# Patient Record
Sex: Male | Born: 1960 | Race: White | Hispanic: No | Marital: Married | Smoking: Never smoker
Health system: Southern US, Community
[De-identification: ages and names within clinical notes are randomized; demographics above are authoritative.]

## PROBLEM LIST (undated history)

## (undated) DIAGNOSIS — E785 Hyperlipidemia, unspecified: Secondary | ICD-10-CM

## (undated) DIAGNOSIS — Z8249 Family history of ischemic heart disease and other diseases of the circulatory system: Secondary | ICD-10-CM

## (undated) DIAGNOSIS — Z87442 Personal history of urinary calculi: Secondary | ICD-10-CM

## (undated) DIAGNOSIS — M1990 Unspecified osteoarthritis, unspecified site: Secondary | ICD-10-CM

## (undated) HISTORY — DX: Family history of ischemic heart disease and other diseases of the circulatory system: Z82.49

## (undated) HISTORY — DX: Hyperlipidemia, unspecified: E78.5

---
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
## 1997-11-25 HISTORY — PX: SHOULDER OPEN ROTATOR CUFF REPAIR: SHX2407

## 2002-02-01 ENCOUNTER — Encounter: Payer: Self-pay | Admitting: Family Medicine

## 2002-02-01 ENCOUNTER — Encounter: Admission: RE | Admit: 2002-02-01 | Discharge: 2002-02-01 | Payer: Self-pay | Admitting: Family Medicine

## 2007-11-26 HISTORY — PX: KNEE ARTHROSCOPY: SHX127

## 2008-12-27 ENCOUNTER — Ambulatory Visit (HOSPITAL_BASED_OUTPATIENT_CLINIC_OR_DEPARTMENT_OTHER): Admission: RE | Admit: 2008-12-27 | Discharge: 2008-12-27 | Payer: Self-pay | Admitting: Orthopaedic Surgery

## 2009-11-25 HISTORY — PX: OTHER SURGICAL HISTORY: SHX169

## 2010-08-04 ENCOUNTER — Emergency Department (HOSPITAL_COMMUNITY): Admission: EM | Admit: 2010-08-04 | Discharge: 2010-08-04 | Payer: Self-pay | Admitting: Emergency Medicine

## 2010-08-07 ENCOUNTER — Encounter: Admission: RE | Admit: 2010-08-07 | Discharge: 2010-08-07 | Payer: Self-pay | Admitting: Orthopedic Surgery

## 2011-03-12 LAB — POCT HEMOGLOBIN-HEMACUE: Hemoglobin: 15.9 g/dL (ref 13.0–17.0)

## 2011-04-09 NOTE — Op Note (Signed)
NAMEMELBOURNE, JAKUBIAK                 ACCOUNT NO.:  [id number]   MEDICAL RECORD NO.:  [id number]          PATIENT TYPE:  AMB   LOCATION:  DSC                          FACILITY:  [facility]   PHYSICIAN:  Lubertha Basque. Dalldorf, M.D.DATE OF BIRTH:  May 25, 1961   DATE OF PROCEDURE:  DATE OF DISCHARGE:                               OPERATIVE REPORT   PREOPERATIVE DIAGNOSIS:  Right knee torn medial meniscus.   POSTOPERATIVE DIAGNOSIS:  Right knee torn medial meniscus.   PROCEDURE:  Right knee partial medial meniscectomy.   ANESTHESIA:  General.   ATTENDING SURGEON:  Lubertha Basque. Jerl Santos, MD   ASSISTANT:  Lindwood Qua, PA   INDICATIONS FOR PROCEDURE:  The patient is a 50 year old male with  several months of intense right knee pain and swelling.  This has  persisted despite oral anti-inflammatories and activity restriction.  By  MRI scan, he has a posterior horn medial meniscus tear.  He has pain  which limits his ability to rest and walk, and he was offered an  arthroscopy.  Informed operative consent was obtained after discussion  of possible complications including reaction to anesthesia and  infection.   SUMMARY OF FINDINGS AND PROCEDURE:  Under general anesthesia, an  arthroscopy of the right knee was performed.  Suprapatellar pouch was  benign, as was the patellofemoral joint.  Medial compartment exhibited  radial tear of the posterior horn medial meniscus addressed with about  10% partial medial meniscectomy back to stable tissues.  He did have  some focal grade 3 change likely associated with this tear, and a  chondroplasty was done but no bare bone was exposed.  The ACL was intact  and the lateral compartment was completely benign.   DESCRIPTION OF PROCEDURE:  The patient was taken to the operating suite  where general anesthetic was applied without difficulty.  He was  positioned supine and prepped and draped in normal sterile fashion.  After administration of IV Kefzol, an  arthroscopy of the right knee was  performed through 2 inferior portals.  Findings were as noted above and  procedure consisted of the partial meniscectomy done with basket and  shaver.  The knee was thoroughly irrigated followed by placement of  Marcaine with epinephrine and morphine.  Adaptic was placed over the  portals followed by dry gauze and a loose Ace wrap.  Estimated blood  loss and intraoperative fluids obtained from anesthesia records.   DISPOSITION:  The patient was extubated in the operating room and taken  to recovery room in stable addition.  He was to go home on the same day  and follow up in the office closely.  I will contact him by phone  tonight.      Lubertha Basque Jerl Santos, M.D.  Electronically Signed     PGD/MEDQ  D:  12/27/2008  T:  12/28/2008  Job:  [id number]

## 2013-07-13 ENCOUNTER — Other Ambulatory Visit: Payer: Self-pay | Admitting: Orthopedic Surgery

## 2013-07-20 ENCOUNTER — Other Ambulatory Visit: Payer: Self-pay | Admitting: Orthopedic Surgery

## 2013-07-21 ENCOUNTER — Encounter (HOSPITAL_COMMUNITY): Payer: Self-pay | Admitting: Pharmacy Technician

## 2013-07-22 NOTE — Pre-Procedure Instructions (Signed)
Jorge Mcclain  07/22/2013   Your procedure is scheduled on:  July 30, 2013 at 7:30 AM  Report to [facility] at 5:30 AM.  Call this number if you have problems the morning of surgery: [phone]    Remember:   Do not eat food or drink liquids after midnight.   Take these medicines the morning of surgery with A SIP OF WATER: HYDROcodone-acetaminophen (NORCO/VICODIN)  Stop all Vitamins, Aspirin, Herbal Medications as of 07/24/13.    Do not wear jewelry, make-up or nail polish.  Do not wear lotions, powders, or perfumes. You may wear deodorant.  Do not shave 48 hours prior to surgery. Men may shave face and neck.  Do not bring valuables to the hospital.  [facility] is not responsible                   for any belongings or valuables.  Contacts, dentures or bridgework may not be worn into surgery.  Leave suitcase in the car. After surgery it may be brought to your room.  For patients admitted to the hospital, checkout time is 11:00 AM the day of  discharge.     Special Instructions: Shower using CHG 2 nights before surgery and the night before surgery.  If you shower the day of surgery use CHG.  Use special wash - you have one bottle of CHG for all showers.  You should use approximately 1/3 of the bottle for each shower.   Please read over the following fact sheets that you were given: Pain Booklet, Coughing and Deep Breathing, Total Joint Packet, MRSA Information and Surgical Site Infection Prevention

## 2013-07-23 ENCOUNTER — Other Ambulatory Visit: Payer: Self-pay | Admitting: Orthopedic Surgery

## 2013-07-23 ENCOUNTER — Encounter (HOSPITAL_COMMUNITY)
Admission: RE | Admit: 2013-07-23 | Discharge: 2013-07-23 | Disposition: A | Discharge status: Home / Self Care | Payer: BC Managed Care – PPO | Source: Ambulatory Visit | Attending: Orthopedic Surgery | Admitting: Orthopedic Surgery

## 2013-07-23 ENCOUNTER — Encounter (HOSPITAL_COMMUNITY): Payer: Self-pay

## 2013-07-23 DIAGNOSIS — Z01818 Encounter for other preprocedural examination: Secondary | ICD-10-CM | POA: Insufficient documentation

## 2013-07-23 DIAGNOSIS — Z01812 Encounter for preprocedural laboratory examination: Secondary | ICD-10-CM | POA: Insufficient documentation

## 2013-07-23 HISTORY — DX: Unspecified osteoarthritis, unspecified site: M19.90

## 2013-07-23 LAB — CBC WITH DIFFERENTIAL/PLATELET
Basophils Absolute: 0 10*3/uL (ref 0.0–0.1)
Basophils Relative: 1 % (ref 0–1)
Eosinophils Absolute: 0.3 10*3/uL (ref 0.0–0.7)
Eosinophils Relative: 4 % (ref 0–5)
HCT: 44.3 % (ref 39.0–52.0)
Hemoglobin: 16.2 g/dL (ref 13.0–17.0)
Lymphocytes Relative: 32 % (ref 12–46)
Lymphs Abs: 2.5 10*3/uL (ref 0.7–4.0)
MCH: 31.3 pg (ref 26.0–34.0)
MCHC: 36.6 g/dL — ABNORMAL HIGH (ref 30.0–36.0)
MCV: 85.7 fL (ref 78.0–100.0)
Monocytes Absolute: 0.6 10*3/uL (ref 0.1–1.0)
Monocytes Relative: 7 % (ref 3–12)
Neutro Abs: 4.3 10*3/uL (ref 1.7–7.7)
Neutrophils Relative %: 56 % (ref 43–77)
Platelets: 245 10*3/uL (ref 150–400)
RBC: 5.17 MIL/uL (ref 4.22–5.81)
RDW: 12.2 % (ref 11.5–15.5)
WBC: 7.7 10*3/uL (ref 4.0–10.5)

## 2013-07-23 LAB — TYPE AND SCREEN
ABO/RH(D): B POS
Antibody Screen: NEGATIVE

## 2013-07-23 LAB — ABO/RH: ABO/RH(D): B POS

## 2013-07-23 LAB — URINALYSIS, ROUTINE W REFLEX MICROSCOPIC
Bilirubin Urine: NEGATIVE
Glucose, UA: NEGATIVE mg/dL
Hgb urine dipstick: NEGATIVE
Ketones, ur: NEGATIVE mg/dL
Leukocytes, UA: NEGATIVE
Nitrite: NEGATIVE
Protein, ur: NEGATIVE mg/dL
Specific Gravity, Urine: 1.022 (ref 1.005–1.030)
Urobilinogen, UA: 0.2 mg/dL (ref 0.0–1.0)
pH: 6.5 (ref 5.0–8.0)

## 2013-07-23 LAB — COMPREHENSIVE METABOLIC PANEL
ALT: 53 U/L (ref 0–53)
AST: 30 U/L (ref 0–37)
Albumin: 4.4 g/dL (ref 3.5–5.2)
Alkaline Phosphatase: 62 U/L (ref 39–117)
BUN: 20 mg/dL (ref 6–23)
CO2: 28 mEq/L (ref 19–32)
Calcium: 10.4 mg/dL (ref 8.4–10.5)
Chloride: 99 mEq/L (ref 96–112)
Creatinine, Ser: 1.12 mg/dL (ref 0.50–1.35)
GFR calc Af Amer: 86 mL/min — ABNORMAL LOW (ref >90)
GFR calc non Af Amer: 74 mL/min — ABNORMAL LOW (ref >90)
Glucose, Bld: 110 mg/dL — ABNORMAL HIGH (ref 70–99)
Potassium: 4.3 mEq/L (ref 3.5–5.1)
Sodium: 140 mEq/L (ref 135–145)
Total Bilirubin: 1.8 mg/dL — ABNORMAL HIGH (ref 0.3–1.2)
Total Protein: 8.4 g/dL — ABNORMAL HIGH (ref 6.0–8.3)

## 2013-07-23 LAB — PROTIME-INR
INR: 1 (ref 0.00–1.49)
Prothrombin Time: 13 seconds (ref 11.6–15.2)

## 2013-07-23 LAB — SURGICAL PCR SCREEN
MRSA, PCR: NEGATIVE
Staphylococcus aureus: POSITIVE — AB

## 2013-07-23 LAB — APTT: aPTT: 30 seconds (ref 24–37)

## 2013-07-23 NOTE — Progress Notes (Signed)
Talked with Jorge Mcclain at Dr Luiz Blare office , she will put laterality in Epic for surgery. The surgery is for Right hip.

## 2013-07-29 MED ORDER — CEFAZOLIN SODIUM-DEXTROSE 2-3 GM-% IV SOLR
2.0000 g | INTRAVENOUS | Status: AC
  Administered 2013-07-30: 2 g via INTRAVENOUS
  Filled 2013-07-29: qty 50

## 2013-07-30 ENCOUNTER — Encounter (HOSPITAL_COMMUNITY)
Admission: RE | Disposition: A | Discharge status: Home / Self Care | Payer: Self-pay | Source: Ambulatory Visit | Attending: Orthopedic Surgery

## 2013-07-30 ENCOUNTER — Inpatient Hospital Stay (HOSPITAL_COMMUNITY)
Admission: RE | Admit: 2013-07-30 | Discharge: 2013-07-31 | DRG: 818 | Disposition: A | Discharge status: Home / Self Care | Payer: BC Managed Care – PPO | Source: Ambulatory Visit | Attending: Orthopedic Surgery | Admitting: Orthopedic Surgery

## 2013-07-30 ENCOUNTER — Ambulatory Visit (HOSPITAL_COMMUNITY): Payer: BC Managed Care – PPO | Admitting: Anesthesiology

## 2013-07-30 ENCOUNTER — Ambulatory Visit (HOSPITAL_COMMUNITY): Payer: BC Managed Care – PPO

## 2013-07-30 ENCOUNTER — Encounter (HOSPITAL_COMMUNITY): Payer: Self-pay | Admitting: *Deleted

## 2013-07-30 ENCOUNTER — Encounter (HOSPITAL_COMMUNITY): Payer: Self-pay | Admitting: Anesthesiology

## 2013-07-30 DIAGNOSIS — M169 Osteoarthritis of hip, unspecified: Principal | ICD-10-CM | POA: Diagnosis present

## 2013-07-30 DIAGNOSIS — M1610 Unilateral primary osteoarthritis, unspecified hip: Principal | ICD-10-CM | POA: Diagnosis present

## 2013-07-30 DIAGNOSIS — M1611 Unilateral primary osteoarthritis, right hip: Secondary | ICD-10-CM

## 2013-07-30 DIAGNOSIS — Z7982 Long term (current) use of aspirin: Secondary | ICD-10-CM

## 2013-07-30 HISTORY — PX: TOTAL HIP ARTHROPLASTY: SHX124

## 2013-07-30 SURGERY — ARTHROPLASTY, HIP, TOTAL, ANTERIOR APPROACH
Anesthesia: General | Site: Hip | Laterality: Right | Wound class: Clean

## 2013-07-30 MED ORDER — PROMETHAZINE HCL 25 MG/ML IJ SOLN: 12.5000 mg | Freq: Four times a day (QID) | INTRAMUSCULAR | Status: DC | PRN

## 2013-07-30 MED ORDER — METHOCARBAMOL 500 MG PO TABS
500.0000 mg | ORAL_TABLET | Freq: Four times a day (QID) | ORAL | Status: DC | PRN
  Administered 2013-07-30: 500 mg via ORAL

## 2013-07-30 MED ORDER — KETOROLAC TROMETHAMINE 15 MG/ML IJ SOLN
15.0000 mg | Freq: Four times a day (QID) | INTRAMUSCULAR | Status: AC
  Administered 2013-07-30 – 2013-07-31 (×4): 15 mg via INTRAVENOUS
  Filled 2013-07-30 (×4): qty 1

## 2013-07-30 MED ORDER — MIDAZOLAM HCL 5 MG/5ML IJ SOLN
INTRAMUSCULAR | Status: DC | PRN
  Administered 2013-07-30: 2 mg via INTRAVENOUS

## 2013-07-30 MED ORDER — ZOLPIDEM TARTRATE 5 MG PO TABS: 5.0000 mg | ORAL_TABLET | Freq: Every evening | ORAL | Status: DC | PRN

## 2013-07-30 MED ORDER — ACETAMINOPHEN 325 MG PO TABS: 650.0000 mg | ORAL_TABLET | Freq: Four times a day (QID) | ORAL | Status: DC | PRN

## 2013-07-30 MED ORDER — 0.9 % SODIUM CHLORIDE (POUR BTL) OPTIME
TOPICAL | Status: DC | PRN
  Administered 2013-07-30: 1000 mL

## 2013-07-30 MED ORDER — HYDROMORPHONE HCL PF 1 MG/ML IJ SOLN
0.2500 mg | INTRAMUSCULAR | Status: DC | PRN
  Administered 2013-07-30: 0.25 mg via INTRAVENOUS
  Administered 2013-07-30 (×3): 0.5 mg via INTRAVENOUS

## 2013-07-30 MED ORDER — METHOCARBAMOL 500 MG PO TABS
ORAL_TABLET | ORAL | Status: AC
  Filled 2013-07-30: qty 1

## 2013-07-30 MED ORDER — LIDOCAINE HCL (CARDIAC) 20 MG/ML IV SOLN
INTRAVENOUS | Status: DC | PRN
  Administered 2013-07-30: 100 mg via INTRAVENOUS

## 2013-07-30 MED ORDER — PROPOFOL 10 MG/ML IV BOLUS
INTRAVENOUS | Status: DC | PRN
  Administered 2013-07-30: 150 mg via INTRAVENOUS

## 2013-07-30 MED ORDER — ASPIRIN EC 325 MG PO TBEC
325.0000 mg | DELAYED_RELEASE_TABLET | Freq: Two times a day (BID) | ORAL | Status: DC
  Administered 2013-07-30 – 2013-07-31 (×2): 325 mg via ORAL
  Filled 2013-07-30 (×4): qty 1

## 2013-07-30 MED ORDER — DEXAMETHASONE 6 MG PO TABS
10.0000 mg | ORAL_TABLET | Freq: Three times a day (TID) | ORAL | Status: AC
  Administered 2013-07-30 – 2013-07-31 (×2): 10 mg via ORAL
  Filled 2013-07-30 (×3): qty 1

## 2013-07-30 MED ORDER — ONDANSETRON HCL 4 MG/2ML IJ SOLN: 4.0000 mg | Freq: Four times a day (QID) | INTRAMUSCULAR | Status: DC | PRN

## 2013-07-30 MED ORDER — DEXAMETHASONE SODIUM PHOSPHATE 4 MG/ML IJ SOLN
INTRAMUSCULAR | Status: DC | PRN
  Administered 2013-07-30: 4 mg via INTRAVENOUS

## 2013-07-30 MED ORDER — DEXAMETHASONE SODIUM PHOSPHATE 10 MG/ML IJ SOLN
10.0000 mg | Freq: Three times a day (TID) | INTRAMUSCULAR | Status: AC
  Administered 2013-07-30: 10 mg via INTRAVENOUS
  Filled 2013-07-30 (×4): qty 1

## 2013-07-30 MED ORDER — SODIUM CHLORIDE 0.9 % IV SOLN
INTRAVENOUS | Status: DC
  Administered 2013-07-31: 01:00:00 via INTRAVENOUS

## 2013-07-30 MED ORDER — OXYCODONE-ACETAMINOPHEN 5-325 MG PO TABS
ORAL_TABLET | ORAL | Status: AC
  Filled 2013-07-30: qty 2

## 2013-07-30 MED ORDER — ONDANSETRON HCL 4 MG/2ML IJ SOLN
INTRAMUSCULAR | Status: DC | PRN
  Administered 2013-07-30: 4 mg via INTRAVENOUS

## 2013-07-30 MED ORDER — POVIDONE-IODINE 7.5 % EX SOLN: Freq: Once | CUTANEOUS | Status: DC

## 2013-07-30 MED ORDER — LACTATED RINGERS IV SOLN
INTRAVENOUS | Status: DC | PRN
  Administered 2013-07-30 (×2): via INTRAVENOUS

## 2013-07-30 MED ORDER — METHOCARBAMOL 100 MG/ML IJ SOLN
500.0000 mg | Freq: Four times a day (QID) | INTRAVENOUS | Status: DC | PRN
  Filled 2013-07-30: qty 5

## 2013-07-30 MED ORDER — NEOSTIGMINE METHYLSULFATE 1 MG/ML IJ SOLN
INTRAMUSCULAR | Status: DC | PRN
  Administered 2013-07-30: 3 mg via INTRAVENOUS

## 2013-07-30 MED ORDER — ALUM & MAG HYDROXIDE-SIMETH 200-200-20 MG/5ML PO SUSP: 30.0000 mL | ORAL | Status: DC | PRN

## 2013-07-30 MED ORDER — SODIUM CHLORIDE 0.9 % IV SOLN
1000.0000 mg | INTRAVENOUS | Status: AC
  Administered 2013-07-30: 1000 mg via INTRAVENOUS
  Filled 2013-07-30: qty 10

## 2013-07-30 MED ORDER — HYDROMORPHONE HCL PF 1 MG/ML IJ SOLN
INTRAMUSCULAR | Status: AC
  Filled 2013-07-30: qty 1

## 2013-07-30 MED ORDER — ACETAMINOPHEN 650 MG RE SUPP: 650.0000 mg | Freq: Four times a day (QID) | RECTAL | Status: DC | PRN

## 2013-07-30 MED ORDER — HYDROMORPHONE HCL PF 1 MG/ML IJ SOLN
INTRAMUSCULAR | Status: AC
  Administered 2013-07-30: 0.5 mg
  Filled 2013-07-30: qty 1

## 2013-07-30 MED ORDER — OXYCODONE-ACETAMINOPHEN 5-325 MG PO TABS: 1.0000 | ORAL_TABLET | Freq: Four times a day (QID) | ORAL | Status: AC | PRN

## 2013-07-30 MED ORDER — ROCURONIUM BROMIDE 100 MG/10ML IV SOLN
INTRAVENOUS | Status: DC | PRN
  Administered 2013-07-30: 20 mg via INTRAVENOUS
  Administered 2013-07-30: 50 mg via INTRAVENOUS

## 2013-07-30 MED ORDER — POLYETHYLENE GLYCOL 3350 17 G PO PACK: 17.0000 g | PACK | Freq: Every day | ORAL | Status: DC | PRN

## 2013-07-30 MED ORDER — DOCUSATE SODIUM 100 MG PO CAPS
100.0000 mg | ORAL_CAPSULE | Freq: Two times a day (BID) | ORAL | Status: DC
  Administered 2013-07-30 – 2013-07-31 (×3): 100 mg via ORAL
  Filled 2013-07-30 (×3): qty 1

## 2013-07-30 MED ORDER — CHLORHEXIDINE GLUCONATE 4 % EX LIQD: 60.0000 mL | Freq: Once | CUTANEOUS | Status: DC

## 2013-07-30 MED ORDER — SUFENTANIL CITRATE 50 MCG/ML IV SOLN
INTRAVENOUS | Status: DC | PRN
  Administered 2013-07-30 (×5): 10 ug via INTRAVENOUS

## 2013-07-30 MED ORDER — ONDANSETRON HCL 4 MG PO TABS: 4.0000 mg | ORAL_TABLET | Freq: Four times a day (QID) | ORAL | Status: DC | PRN

## 2013-07-30 MED ORDER — GLYCOPYRROLATE 0.2 MG/ML IJ SOLN
INTRAMUSCULAR | Status: DC | PRN
  Administered 2013-07-30: 0.4 mg via INTRAVENOUS

## 2013-07-30 MED ORDER — OXYCODONE-ACETAMINOPHEN 5-325 MG PO TABS
1.0000 | ORAL_TABLET | ORAL | Status: DC | PRN
  Administered 2013-07-30 – 2013-07-31 (×3): 2 via ORAL
  Filled 2013-07-30 (×2): qty 2

## 2013-07-30 MED ORDER — HYDROMORPHONE HCL PF 1 MG/ML IJ SOLN
1.0000 mg | INTRAMUSCULAR | Status: DC | PRN
  Administered 2013-07-30: 1 mg via INTRAVENOUS
  Filled 2013-07-30 (×2): qty 1

## 2013-07-30 MED ORDER — CEFAZOLIN SODIUM-DEXTROSE 2-3 GM-% IV SOLR
2.0000 g | Freq: Four times a day (QID) | INTRAVENOUS | Status: AC
Start: 2013-07-30 — End: 2013-07-30
  Administered 2013-07-30 (×2): 2 g via INTRAVENOUS
  Filled 2013-07-30 (×2): qty 50

## 2013-07-30 MED ORDER — ASPIRIN EC 325 MG PO TBEC: 325.0000 mg | DELAYED_RELEASE_TABLET | Freq: Two times a day (BID) | ORAL | Status: DC

## 2013-07-30 SURGICAL SUPPLY — 50 items
BANDAGE GAUZE ELAST BULKY 4 IN (GAUZE/BANDAGES/DRESSINGS) IMPLANT
BLADE SAW SGTL 18X1.27X75 (BLADE) ×2 IMPLANT
BLADE SURG ROTATE 9660 (MISCELLANEOUS) IMPLANT
BNDG COHESIVE 6X5 TAN STRL LF (GAUZE/BANDAGES/DRESSINGS) IMPLANT
CAPT HIP PF COP ×2 IMPLANT
CELLS DAT CNTRL 66122 CELL SVR (MISCELLANEOUS) ×1 IMPLANT
CLOTH BEACON ORANGE TIMEOUT ST (SAFETY) ×2 IMPLANT
COVER BACK TABLE 24X17X13 BIG (DRAPES) IMPLANT
COVER SURGICAL LIGHT HANDLE (MISCELLANEOUS) ×2 IMPLANT
DRAPE C-ARM 42X72 X-RAY (DRAPES) ×2 IMPLANT
DRAPE STERI IOBAN 125X83 (DRAPES) ×2 IMPLANT
DRAPE U-SHAPE 47X51 STRL (DRAPES) ×6 IMPLANT
DRSG AQUACEL AG ADV 3.5X10 (GAUZE/BANDAGES/DRESSINGS) ×2 IMPLANT
DRSG MEPILEX BORDER 4X8 (GAUZE/BANDAGES/DRESSINGS) ×2 IMPLANT
DURAPREP 26ML APPLICATOR (WOUND CARE) ×2 IMPLANT
ELECT BLADE 4.0 EZ CLEAN MEGAD (MISCELLANEOUS)
ELECT BLADE TIP CTD 4 INCH (ELECTRODE) ×2 IMPLANT
ELECT CAUTERY BLADE 6.4 (BLADE) ×2 IMPLANT
ELECT REM PT RETURN 9FT ADLT (ELECTROSURGICAL) ×2
ELECTRODE BLDE 4.0 EZ CLN MEGD (MISCELLANEOUS) IMPLANT
ELECTRODE REM PT RTRN 9FT ADLT (ELECTROSURGICAL) ×1 IMPLANT
GAUZE XEROFORM 1X8 LF (GAUZE/BANDAGES/DRESSINGS) ×2 IMPLANT
GLOVE BIOGEL PI IND STRL 8 (GLOVE) ×2 IMPLANT
GLOVE BIOGEL PI INDICATOR 8 (GLOVE) ×2
GLOVE ECLIPSE 7.5 STRL STRAW (GLOVE) ×4 IMPLANT
GOWN STRL NON-REIN LRG LVL3 (GOWN DISPOSABLE) ×4 IMPLANT
GOWN STRL REIN XL XLG (GOWN DISPOSABLE) ×4 IMPLANT
HOOD PEEL AWAY FACE SHEILD DIS (HOOD) ×6 IMPLANT
KIT BASIN OR (CUSTOM PROCEDURE TRAY) ×2 IMPLANT
KIT ROOM TURNOVER OR (KITS) ×2 IMPLANT
MANIFOLD NEPTUNE II (INSTRUMENTS) ×2 IMPLANT
NS IRRIG 1000ML POUR BTL (IV SOLUTION) ×2 IMPLANT
PACK TOTAL JOINT (CUSTOM PROCEDURE TRAY) ×2 IMPLANT
PAD ARMBOARD 7.5X6 YLW CONV (MISCELLANEOUS) ×4 IMPLANT
RTRCTR WOUND ALEXIS 18CM MED (MISCELLANEOUS) ×2
SPONGE LAP 18X18 X RAY DECT (DISPOSABLE) IMPLANT
STAPLER VISISTAT 35W (STAPLE) IMPLANT
STRIP CLOSURE SKIN 1/2X4 (GAUZE/BANDAGES/DRESSINGS) IMPLANT
SUT ETHIBOND NAB CT1 #1 30IN (SUTURE) IMPLANT
SUT MNCRL AB 3-0 PS2 18 (SUTURE) IMPLANT
SUT VIC AB 0 CT1 27 (SUTURE) ×1
SUT VIC AB 0 CT1 27XBRD ANBCTR (SUTURE) ×1 IMPLANT
SUT VIC AB 1 CT1 27 (SUTURE) ×4
SUT VIC AB 1 CT1 27XBRD ANBCTR (SUTURE) ×4 IMPLANT
SUT VIC AB 2-0 CT1 27 (SUTURE) ×1
SUT VIC AB 2-0 CT1 TAPERPNT 27 (SUTURE) ×1 IMPLANT
TOWEL OR 17X24 6PK STRL BLUE (TOWEL DISPOSABLE) ×2 IMPLANT
TOWEL OR 17X26 10 PK STRL BLUE (TOWEL DISPOSABLE) ×2 IMPLANT
TRAY FOLEY CATH 16FRSI W/METER (SET/KITS/TRAYS/PACK) IMPLANT
WATER STERILE IRR 1000ML POUR (IV SOLUTION) ×4 IMPLANT

## 2013-07-30 NOTE — H&P (Signed)
TOTAL HIP ADMISSION H&P  Patient is admitted for right total hip arthroplasty.  Subjective:  Chief Complaint: right hip pain  HPI: Jorge Mcclain, 52 y.o. male, has a history of pain and functional disability in the right hip(s) due to arthritis and patient has failed non-surgical conservative treatments for greater than 12 weeks to include NSAID's and/or analgesics, corticosteriod injections, viscosupplementation injections, flexibility and strengthening excercises and activity modification.  Onset of symptoms was gradual starting 4 years ago with gradually worsening course since that time.The patient noted no past surgery on the right hip(s).  Patient currently rates pain in the right hip at 7 out of 10 with activity. Patient has night pain, worsening of pain with activity and weight bearing, pain that interfers with activities of daily living, pain with passive range of motion and joint swelling. Patient has evidence of subchondral sclerosis, periarticular osteophytes and joint space narrowing by imaging studies. This condition presents safety issues increasing the risk of falls. This patient has had failure of all conservative care.  There is no current active infection.  There are no active problems to display for this patient.  Past Medical History  Diagnosis Date  . Arthritis     Past Surgical History  Procedure Laterality Date  . Knee arthroscopy Right 2009  . Shoulder open rotator cuff repair Right 1999  . Bicef reattachment Right 2011  . Hernia repair Left 1965    groin    Prescriptions prior to admission  Medication Sig Dispense Refill  . aspirin EC 81 MG tablet Take 81 mg by mouth daily.      . Coenzyme Q10 (CO Q 10) 100 MG CAPS Take 1 capsule by mouth daily.      . Flaxseed, Linseed, 1000 MG CAPS Take 1 capsule by mouth daily.      Marland Kitchen glucosamine-chondroitin 500-400 MG tablet Take 1 tablet by mouth daily.      Marland Kitchen HYDROcodone-acetaminophen (NORCO/VICODIN) 5-325 MG per tablet  Take 1 tablet by mouth every 8 (eight) hours as needed for pain.      . Multiple Vitamin (MULTIVITAMIN) tablet Take 1 tablet by mouth daily.      . OMEGA 3 1000 MG CAPS Take 1 capsule by mouth daily.      . simvastatin (ZOCOR) 80 MG tablet Take 80 mg by mouth at bedtime.       No Known Allergies  History  Substance Use Topics  . Smoking status: Never Smoker   . Smokeless tobacco: Never Used  . Alcohol Use: Yes     Comment: occasionally    History reviewed. No pertinent family history.   ROS ROS: I have reviewed the patient's review of systems thoroughly and there are no positive responses as relates to the HPI. Objective:  Physical Exam  Vital signs in last 24 hours: Temp:  [97.7 F (36.5 C)] 97.7 F (36.5 C) (09/05 0601) Pulse Rate:  [71] 71 (09/05 0601) Resp:  [20] 20 (09/05 0601) BP: (136)/(82) 136/82 mmHg (09/05 0601) SpO2:  [97 %] 97 % (09/05 0601) Well-developed well-nourished patient in no acute distress. Alert and oriented x3 HEENT:within normal limits Cardiac: Regular rate and rhythm Pulmonary: Lungs clear to auscultation Abdomen: Soft and nontender.  Normal active bowel sounds  Musculoskeletal: r hip pain on rom.  Limited er and flexion Labs: Recent Results (from the past 2160 hour(s))  URINALYSIS, ROUTINE W REFLEX MICROSCOPIC     Status: None   Collection Time    07/23/13  9:53 AM  Result Value Range   Color, Urine YELLOW  YELLOW   APPearance CLEAR  CLEAR   Specific Gravity, Urine 1.022  1.005 - 1.030   pH 6.5  5.0 - 8.0   Glucose, UA NEGATIVE  NEGATIVE mg/dL   Hgb urine dipstick NEGATIVE  NEGATIVE   Bilirubin Urine NEGATIVE  NEGATIVE   Ketones, ur NEGATIVE  NEGATIVE mg/dL   Protein, ur NEGATIVE  NEGATIVE mg/dL   Urobilinogen, UA 0.2  0.0 - 1.0 mg/dL   Nitrite NEGATIVE  NEGATIVE   Leukocytes, UA NEGATIVE  NEGATIVE   Comment: MICROSCOPIC NOT DONE ON URINES WITH NEGATIVE PROTEIN, BLOOD, LEUKOCYTES, NITRITE, OR GLUCOSE <1000 mg/dL.  SURGICAL PCR  SCREEN     Status: Abnormal   Collection Time    07/23/13  9:53 AM      Result Value Range   MRSA, PCR NEGATIVE  NEGATIVE   Staphylococcus aureus POSITIVE (*) NEGATIVE   Comment:            The Xpert SA Assay (FDA     approved for NASAL specimens     in patients over 75 years of age),     is one component of     a comprehensive surveillance     program.  Test performance has     been validated by [employer] for patients greater     than or equal to 47 year old.     It is not intended     to diagnose infection nor to     guide or monitor treatment.  TYPE AND SCREEN     Status: None   Collection Time    07/23/13 10:00 AM      Result Value Range   ABO/RH(D) B POS     Antibody Screen NEG     Sample Expiration 08/06/2013    ABO/RH     Status: None   Collection Time    07/23/13 10:00 AM      Result Value Range   ABO/RH(D) B POS    APTT     Status: None   Collection Time    07/23/13 10:01 AM      Result Value Range   aPTT 30  24 - 37 seconds  CBC WITH DIFFERENTIAL     Status: Abnormal   Collection Time    07/23/13 10:01 AM      Result Value Range   WBC 7.7  4.0 - 10.5 K/uL   RBC 5.17  4.22 - 5.81 MIL/uL   Hemoglobin 16.2  13.0 - 17.0 g/dL   HCT 91.4  78.2 - 95.6 %   MCV 85.7  78.0 - 100.0 fL   MCH 31.3  26.0 - 34.0 pg   MCHC 36.6 (*) 30.0 - 36.0 g/dL   RDW 21.3  08.6 - 57.8 %   Platelets 245  150 - 400 K/uL   Neutrophils Relative % 56  43 - 77 %   Neutro Abs 4.3  1.7 - 7.7 K/uL   Lymphocytes Relative 32  12 - 46 %   Lymphs Abs 2.5  0.7 - 4.0 K/uL   Monocytes Relative 7  3 - 12 %   Monocytes Absolute 0.6  0.1 - 1.0 K/uL   Eosinophils Relative 4  0 - 5 %   Eosinophils Absolute 0.3  0.0 - 0.7 K/uL   Basophils Relative 1  0 - 1 %   Basophils Absolute 0.0  0.0 - 0.1 K/uL  COMPREHENSIVE METABOLIC PANEL     Status: Abnormal   Collection Time    07/23/13 10:01 AM      Result Value Range   Sodium 140  135 - 145 mEq/L   Potassium 4.3  3.5 - 5.1 mEq/L   Chloride 99   96 - 112 mEq/L   CO2 28  19 - 32 mEq/L   Glucose, Bld 110 (*) 70 - 99 mg/dL   BUN 20  6 - 23 mg/dL   Creatinine, Ser 8.29  0.50 - 1.35 mg/dL   Calcium 56.2  8.4 - 13.0 mg/dL   Total Protein 8.4 (*) 6.0 - 8.3 g/dL   Albumin 4.4  3.5 - 5.2 g/dL   AST 30  0 - 37 U/L   ALT 53  0 - 53 U/L   Alkaline Phosphatase 62  39 - 117 U/L   Total Bilirubin 1.8 (*) 0.3 - 1.2 mg/dL   GFR calc non Af Amer 74 (*) >90 mL/min   GFR calc Af Amer 86 (*) >90 mL/min   Comment: (NOTE)     The eGFR has been calculated using the CKD EPI equation.     This calculation has not been validated in all clinical situations.     eGFR's persistently <90 mL/min signify possible Chronic Kidney     Disease.  PROTIME-INR     Status: None   Collection Time    07/23/13 10:01 AM      Result Value Range   Prothrombin Time 13.0  11.6 - 15.2 seconds   INR 1.00  0.00 - 1.49    There is no weight on file to calculate BMI.   Imaging Review Plain radiographs demonstrate severe degenerative joint disease of the right hip(s). The bone quality appears to be good for age and reported activity level.  Assessment/Plan:  End stage arthritis, right hip(s)  The patient history, physical examination, clinical judgement of the provider and imaging studies are consistent with end stage degenerative joint disease of the right hip(s) and total hip arthroplasty is deemed medically necessary. The treatment options including medical management, injection therapy, arthroscopy and arthroplasty were discussed at length. The risks and benefits of total hip arthroplasty were presented and reviewed. The risks due to aseptic loosening, infection, stiffness, dislocation/subluxation,  thromboembolic complications and other imponderables were discussed.  The patient acknowledged the explanation, agreed to proceed with the plan and consent was signed. Patient is being admitted for inpatient treatment for surgery, pain control, PT, OT, prophylactic  antibiotics, VTE prophylaxis, progressive ambulation and ADL's and discharge planning.The patient is planning to be discharged home with home health services

## 2013-07-30 NOTE — Progress Notes (Signed)
Orthopedic Tech Progress Note Patient Details:  Jorge Mcclain 05-Oct-1961 [id number]  Patient ID: [name], male   DOB: Dec 03, 1960, 52 y.o.   MRN: [id number] Trapeze bar patient helper; viewed order from rn order list  Nikki Dom 07/30/2013, 10:36 PM

## 2013-07-30 NOTE — Progress Notes (Signed)
UR COMPLETED  

## 2013-07-30 NOTE — Brief Op Note (Signed)
07/30/2013  11:18 AM  PATIENT:  Jorge Mcclain  52 y.o. male  PRE-OPERATIVE DIAGNOSIS:  degenerative joint disease  POST-OPERATIVE DIAGNOSIS:  degenerative joint disease  PROCEDURE:  Procedure(s): TOTAL HIP ARTHROPLASTY ANTERIOR APPROACH (Right)  SURGEON:  Surgeon(s) and Role:    * Harvie Junior, MD - Primary  PHYSICIAN ASSISTANT:   ASSISTANTS: bethune   ANESTHESIA:   general  EBL:  Total I/O In: 2000 [I.V.:2000] Out: 900 [Blood:900]  BLOOD ADMINISTERED:none  DRAINS: none   LOCAL MEDICATIONS USED:  MARCAINE     SPECIMEN:  No Specimen  DISPOSITION OF SPECIMEN:  N/A  COUNTS:  YES  TOURNIQUET:  * No tourniquets in log *  DICTATION: .Other Dictation: Dictation Number [id number]  PLAN OF CARE: Admit to inpatient   PATIENT DISPOSITION:  PACU - hemodynamically stable.   Delay start of Pharmacological VTE agent (>24hrs) due to surgical blood loss or risk of bleeding: no

## 2013-07-30 NOTE — Op Note (Signed)
NAMELORAINE, FREID                 ACCOUNT NO.:  [id number]  MEDICAL RECORD NO.:  [id number]  LOCATION:  5N30C                        FACILITY:  [facility]  PHYSICIAN:  Harvie Junior, M.D.   DATE OF BIRTH:  1961/08/01  DATE OF PROCEDURE:  07/30/2013 DATE OF DISCHARGE:                              OPERATIVE REPORT   PREOPERATIVE DIAGNOSIS:  End-stage degenerative joint disease, right hip.  POSTOPERATIVE DIAGNOSIS:  End-stage degenerative joint disease, right hip.  PROCEDURE:  Right total hip replacement with a pinnacle cup, size 56 mm, +4 neutral polyethylene liner, a size 12 Corail stem, and +1.5 ceramic hip ball.  SURGEON:  Harvie Junior, M.D.  ASSISTANT:  Marshia Ly, P.A.  ANESTHESIA:  General.  BRIEF HISTORY:  Ms. Mayotte is a 52 year old male with a history of having significant complaints of right hip pain.  X-ray showed bone-on- bone change.  He was having night pain, light activity pain and after failure of all conservative care, he was taken to the operating room for right total hip replacement because of his young age.  In desire for early return to activity, anterior hip was chosen, and he was brought to the operating room for this procedure.  DETAILS OF PROCEDURE:  The patient was brought to the operating room. After adequate anesthesia was achieved with general anesthetic, the patient was placed supine on the operating table.  He was then moved onto the Hana traction table.  The right hip was then prepped and draped in the usual sterile fashion.  Following this, incision was made just distal and lateral to the anterior superior iliac spine towards the femur following the line of the tensor fascia.  Incision was made in the subcutaneous tissue down to the level of the extensor fascia, and a diaphragm retractors put in place.  The tensor fascia was divided in line with its fibers, and the fascia was retracted anteriorly and the muscle taken off the backside of  the fascia and dissected down to the hip capsule.  Retractors were put in place superior and inferior to the neck.  Once this was achieved, the capsule was opened and tagged, and once this was done, a provisional neck cut was made under fluoroscopic imaging.  Once this was achieved, the attention was turned towards to the acetabulum, sequentially reamed to a level of 55 mm and a 56 mm pinnacle cup was put in place.  A 56 mm pinnacle cup was put in place with 45 degrees of lateral opening and 20 degrees of anteversion under fluoroscopic imaging.  This was hammered all the way down, and excellent position of the cup was achieved under fluoro as well was visually. Hole eliminator was used and the poly was put in place for 32 mm head. Attention was turned to the femoral side, where after with the Hana table and external rotation with the leg in the extended and adducted position, we made an introductory pilot hole into the femur and then sequentially broached this up to a size 12.  Size 12 got perfect fit within the canal, and once this was done, the trial broach was placed with the +0 ball and excellent range  of motion, stability, and alignment were achieved.  Fluoro was used to check this.  Once this was done, this trial was removed.  The final 12 was placed and seated, and then a plus ceramic ball was used because of his young age, 52 mm ceramic ball was placed and another reduction was undertaken.  Excellent range of motion and stability.  No tendency towards instability with external rotation. At this point, the wound was copiously and thoroughly irrigated and suctioned dry.  The tensor fascia was closed with 1 Vicryl running, the skin with 0 and 2-0 Vicryl and 3-0 Monocryl subcuticular.  Benzoin and Steri-Strips were applied.  Sterile compressive dressing was applied. The patient was taken to the recovery room and noted to be in satisfactory condition.  Estimated blood loss for the  procedure was about 600 mL.     Harvie Junior, M.D.     Ranae Plumber  D:  07/30/2013  T:  07/30/2013  Job:  [id number]

## 2013-07-30 NOTE — Anesthesia Preprocedure Evaluation (Addendum)
Anesthesia Evaluation  Patient identified by MRN, date of birth, ID band Patient awake    Reviewed: Allergy & Precautions, H&P , NPO status , Patient's Chart, lab work & pertinent test results  Airway Mallampati: II      Dental   Pulmonary neg pulmonary ROS,  breath sounds clear to auscultation        Cardiovascular negative cardio ROS  Rhythm:Regular Rate:Normal     Neuro/Psych    GI/Hepatic negative GI ROS,   Endo/Other  negative endocrine ROS  Renal/GU negative Renal ROS     Musculoskeletal   Abdominal   Peds  Hematology   Anesthesia Other Findings   Reproductive/Obstetrics                          Anesthesia Physical Anesthesia Plan  ASA: II  Anesthesia Plan: General   Post-op Pain Management:    Induction: Intravenous  Airway Management Planned: Oral ETT  Additional Equipment:   Intra-op Plan:   Post-operative Plan:   Informed Consent:   Dental advisory given  Plan Discussed with: CRNA, Anesthesiologist and Surgeon  Anesthesia Plan Comments:         Anesthesia Quick Evaluation

## 2013-07-30 NOTE — Transfer of Care (Signed)
Immediate Anesthesia Transfer of Care Note  Patient: Jorge Mcclain  Procedure(s) Performed: Procedure(s): TOTAL HIP ARTHROPLASTY ANTERIOR APPROACH (Right)  Patient Location: PACU  Anesthesia Type:General  Level of Consciousness: oriented, sedated, patient cooperative and responds to stimulation  Airway & Oxygen Therapy: Patient Spontanous Breathing and Patient connected to nasal cannula oxygen  Post-op Assessment: Report given to PACU RN, Post -op Vital signs reviewed and stable and Patient moving all extremities X 4  Post vital signs: Reviewed and stable  Complications: No apparent anesthesia complications

## 2013-07-30 NOTE — Anesthesia Postprocedure Evaluation (Signed)
  Anesthesia Post-op Note  Patient: Jorge Mcclain  Procedure(s) Performed: Procedure(s): TOTAL HIP ARTHROPLASTY ANTERIOR APPROACH (Right)  Patient Location: PACU  Anesthesia Type:General  Level of Consciousness: awake  Airway and Oxygen Therapy: Patient Spontanous Breathing  Post-op Pain: mild  Post-op Assessment: Post-op Vital signs reviewed  Post-op Vital Signs: Reviewed  Complications: No apparent anesthesia complications

## 2013-07-30 NOTE — Evaluation (Signed)
Physical Therapy Evaluation Patient Details Name: Jorge Mcclain MRN: [id number] DOB: 1961/08/17 Today's Date: 07/30/2013 Time: 4098-1191 PT Time Calculation (min): 32 min  PT Assessment / Plan / Recommendation History of Present Illness  52 y.o. male admitted to [facility] on 07/30/13 for elective R direct anterior THA.  He is WBAT.    Clinical Impression  Pt is POD #0 and feeling lightheaded in sitting.  BP is 70s/40s and did not improve with ankle pumps and 5 mins fully reclined in the chair.  Pt returned to bed.  The good news is that he moved with very little assistance today and should progress well with PT.  I do recommend OT consult to assess the need for bathroom equipment.  PT to follow acutely for deficits listed below.      PT Assessment  Patient needs continued PT services    Follow Up Recommendations  Home health PT;Supervision/Assistance - 24 hour    Does the patient have the potential to tolerate intense rehabilitation     Yes  Barriers to Discharge Inaccessible home environment Has a flight of stairs to get up to his bedroom    Equipment Recommendations  None recommended by PT    Recommendations for Other Services OT consult   Frequency 7X/week    Precautions / Restrictions Restrictions RLE Weight Bearing: Weight bearing as tolerated   Pertinent Vitals/Pain BP low after transfer to recliner, so had to return to bed.  BP in recliner 70s/40s.  RN informed.       Mobility  Bed Mobility Bed Mobility: Supine to Sit;Sitting - Scoot to Edge of Bed;Sit to Supine Supine to Sit: 4: Min assist;With rails;HOB elevated Sitting - Scoot to Edge of Bed: 4: Min assist;With rail Sit to Supine: 4: Min assist;HOB flat;With rail Details for Bed Mobility Assistance: min assist to help pt with right leg. Verbal cues for hand placement and sequencing of movement.  Transfers Transfers: Sit to Stand;Stand to Sit;Stand Pivot Transfers Sit to Stand: 4: Min assist;From elevated surface;With  upper extremity assist;With armrests;From bed;From chair/3-in-1 Stand to Sit: 4: Min assist;With upper extremity assist;With armrests;To bed;To chair/3-in-1 Stand Pivot Transfers: 4: Min assist;With armrests Details for Transfer Assistance: min assist to stand from elevated bed and lower recliner chair.  Heavy reliance on hands for support.  Verbal cues for safe hand placement.   Ambulation/Gait Ambulation/Gait Assistance: 4: Min assist Ambulation Distance (Feet): 8 Feet Assistive device: Rolling walker Ambulation/Gait Assistance Details: min assist to steady pt for balance.  Verbal cues for leg sequencing.   Gait Pattern: Step-to pattern    Exercises Total Joint Exercises Ankle Circles/Pumps: AROM;Both;20 reps Quad Sets: AROM;Both;10 reps Heel Slides: AAROM;Right;10 reps Hip ABduction/ADduction: AAROM;Right;10 reps   PT Diagnosis: Difficulty walking;Abnormality of gait;Acute pain;Generalized weakness  PT Problem List: Decreased strength;Decreased activity tolerance;Decreased balance;Decreased range of motion;Decreased mobility;Decreased knowledge of use of DME;Decreased knowledge of precautions;Pain PT Treatment Interventions: DME instruction;Gait training;Stair training;Functional mobility training;Therapeutic activities;Therapeutic exercise;Balance training;Neuromuscular re-education;Patient/family education;Modalities     PT Goals(Current goals can be found in the care plan section) Acute Rehab PT Goals Patient Stated Goal: to go home PT Goal Formulation: With patient Time For Goal Achievement: 08/06/13 Potential to Achieve Goals: Good  Visit Information  Last PT Received On: 07/30/13 Assistance Needed: +1 History of Present Illness: 52 y.o. male admitted to [facility] on 07/30/13 for elective R direct anterior THA.  He is WBAT.         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  Communication Communication: No difficulties    Cognition  Cognition Arousal/Alertness: Lethargic Behavior During Therapy: WFL for tasks assessed/performed Overall Cognitive Status: Within Functional Limits for tasks assessed    Extremity/Trunk Assessment Upper Extremity Assessment Upper Extremity Assessment: Overall WFL for tasks assessed Lower Extremity Assessment Lower Extremity Assessment: RLE deficits/detail RLE Deficits / Details: ankle 3+/5, knee 3/5, hip 2+/5 Cervical / Trunk Assessment Cervical / Trunk Assessment: Normal   Balance Balance Balance Assessed: Yes Static Sitting Balance Static Sitting - Balance Support: Bilateral upper extremity supported;Feet supported Static Sitting - Level of Assistance: 5: Stand by assistance Static Standing Balance Static Standing - Balance Support: Bilateral upper extremity supported Static Standing - Level of Assistance: 4: Min assist  End of Session PT - End of Session Activity Tolerance: Patient limited by fatigue;Patient limited by lethargy Patient left: in bed;with call bell/phone within reach;with family/visitor present Nurse Communication: Mobility status;Other (comment) (low BP in chair, returned to bed)    Lurena Joiner B. Damarien Nyman, PT, DPT [phone]   07/30/2013, 5:04 PM

## 2013-07-31 LAB — BASIC METABOLIC PANEL
BUN: 19 mg/dL (ref 6–23)
CO2: 24 mEq/L (ref 19–32)
Calcium: 9.1 mg/dL (ref 8.4–10.5)
Chloride: 100 mEq/L (ref 96–112)
Creatinine, Ser: 1.06 mg/dL (ref 0.50–1.35)
GFR calc Af Amer: >90 mL/min (ref >90)
GFR calc non Af Amer: 79 mL/min — ABNORMAL LOW (ref >90)
Glucose, Bld: 161 mg/dL — ABNORMAL HIGH (ref 70–99)
Potassium: 4.8 mEq/L (ref 3.5–5.1)
Sodium: 135 mEq/L (ref 135–145)

## 2013-07-31 LAB — CBC
HCT: 34.9 % — ABNORMAL LOW (ref 39.0–52.0)
Hemoglobin: 12.6 g/dL — ABNORMAL LOW (ref 13.0–17.0)
MCH: 30.9 pg (ref 26.0–34.0)
MCHC: 36.1 g/dL — ABNORMAL HIGH (ref 30.0–36.0)
MCV: 85.5 fL (ref 78.0–100.0)
Platelets: 241 10*3/uL (ref 150–400)
RBC: 4.08 MIL/uL — ABNORMAL LOW (ref 4.22–5.81)
RDW: 12.3 % (ref 11.5–15.5)
WBC: 16.4 10*3/uL — ABNORMAL HIGH (ref 4.0–10.5)

## 2013-07-31 NOTE — Progress Notes (Signed)
Subjective: 1 Day Post-Op Procedure(s) (LRB): TOTAL HIP ARTHROPLASTY ANTERIOR APPROACH (Right) Patient has been out of bed with therapy but has not done stairs yet.  Activity level:  Weightbearing as tolerated right side. Diet tolerance:  Okay Voiding:  Okay Patient reports pain as 3 on 0-10 scale.    Objective: Vital signs in last 24 hours: Temp:  [97.3 F (36.3 C)-98.3 F (36.8 C)] 97.3 F (36.3 C) (09/06 0608) Pulse Rate:  [57-89] 57 (09/06 0608) Resp:  [6-19] 18 (09/06 0608) BP: (74-119)/(47-70) 116/59 mmHg (09/06 0608) SpO2:  [94 %-100 %] 98 % (09/06 0608)  Labs:  Recent Labs  07/31/13 0510  HGB 12.6*    Recent Labs  07/31/13 0510  WBC 16.4*  RBC 4.08*  HCT 34.9*  PLT 241    Recent Labs  07/31/13 0510  NA 135  K 4.8  CL 100  CO2 24  BUN 19  CREATININE 1.06  GLUCOSE 161*  CALCIUM 9.1   No results found for this basename: LABPT, INR,  in the last 72 hours  Physical Exam:  Neurologically intact ABD soft Neurovascular intact Sensation intact distally Intact pulses distally Dorsiflexion/Plantar flexion intact Incision: dressing C/D/I No cellulitis present Compartment soft  Assessment/Plan:  1 Day Post-Op Procedure(s) (LRB): TOTAL HIP ARTHROPLASTY ANTERIOR APPROACH (Right) Advance diet Up with therapy Discharge home with home health If he can pass therapy he can go today.  ASA 325 1 pill twice a day x2-4 weeks. Return to office in 2 weeks to see Dr. Luiz Blare. Pre-prescription meds are on the chart. Home therapy    Liani Caris R 07/31/2013, 8:05 AM

## 2013-07-31 NOTE — Progress Notes (Signed)
   CARE MANAGEMENT NOTE 07/31/2013  Patient:  Jorge Mcclain, Jorge Mcclain   Account Number:  [id number]  Date Initiated:  07/31/2013  Documentation initiated by:  [facility]  Subjective/Objective Assessment:   adm for TOTAL HIP ARTHROPLASTY ANTERIOR APPROACH (Right)     Action/Plan:   Anticipated DC Date:  07/31/2013   Anticipated DC Plan:  HOME W HOME HEALTH SERVICES      DC Planning Services  CM consult      [facility] Choice  HOME HEALTH   Choice offered to / List presented to:     DME arranged  WALKER - ROLLING  3-N-1      DME agency  [facility]     HH arranged  HH-2 PT  HH-3 OT      [facility] agency  [facility]   Status of service:  Completed, signed off Medicare Important Message given?   (If response is "NO", the following Medicare IM given date fields will be blank) Date Medicare IM given:   Date Additional Medicare IM given:    Discharge Disposition:  HOME W HOME HEALTH SERVICES  Per UR Regulation:    If discussed at Long Length of Stay Meetings, dates discussed:    Comments:  07/31/13 10:00 CM met with pt in room and offered choice.  Pt chose [facility] for HHPT/OT.  Referral faxed to [facility].  Rolling walker and 3n1 to be delivered to room prior to discharge. No other CM needs were communicated.  Freddy Jaksch, BSN, CM [phone].

## 2013-07-31 NOTE — Progress Notes (Signed)
Physical Therapy Treatment Patient Details Name: Jorge Mcclain MRN: [id number] DOB: 03-23-1961 Today's Date: 07/31/2013 Time: 1191-4782 PT Time Calculation (min): 40 min  PT Assessment / Plan / Recommendation  History of Present Illness 52 y.o. male admitted to [facility] on 07/30/13 for elective R direct anterior THA.  He is WBAT.     PT Comments   Pt. With pain under good control for mobility.  He was able to ambulate 500 ft. Around unit and practice ascending and descending 2 steps with instructions on negotiating full flight in same manner except to stand to rest after several steps, then proceed when he feels rested.  He and wife believe they are ready for pt. To Dc and he was at supervision level on level surfaces and min guard on steps.  He was safe in his session this am.  Anticipate DC home this am.    Follow Up Recommendations  Home health PT;Supervision/Assistance - 24 hour     Does the patient have the potential to tolerate intense rehabilitation     Barriers to Discharge        Equipment Recommendations  3in1 (PT);Other (comment) (has RW at home per chart)    Recommendations for Other Services    Frequency 7X/week   Progress towards PT Goals    Plan Current plan remains appropriate    Precautions / Restrictions Precautions Precautions: None Restrictions Weight Bearing Restrictions: Yes RLE Weight Bearing: Weight bearing as tolerated   Pertinent Vitals/Pain See vitals tab     Mobility  Bed Mobility Bed Mobility: Supine to Sit;Sitting - Scoot to Edge of Bed Supine to Sit: HOB elevated;With rails;6: Modified independent (Device/Increase time) Sitting - Scoot to Edge of Bed: 6: Modified independent (Device/Increase time) Details for Bed Mobility Assistance: no physical assist needed , cues for technique and safety given Transfers Transfers: Sit to Stand;Stand to Sit Sit to Stand: 4: Min guard;With upper extremity assist;From bed Stand to Sit: 4: Min guard;With upper  extremity assist;With armrests;To chair/3-in-1 Details for Transfer Assistance: min guard assist for safety, cues for safe technique Ambulation/Gait Ambulation/Gait Assistance: 5: Supervision Ambulation Distance (Feet): 500 Feet Assistive device: Rolling walker Ambulation/Gait Assistance Details: needed occasional cue for step length for safe use of RW , as she at times step too far up into RW with surgical leg.  Overall, no overt LOB and ambulating at supervision level Gait Pattern: Step-through pattern Stairs: Yes Stairs Assistance: 4: Min guard Stairs Assistance Details (indicate cue type and reason): cues for technique and use of Left handrail, no physical assist needed on right side and good use of left rail to support himself. Stair Management Technique: One rail Left;Step to pattern;Forwards Number of Stairs: 3    Exercises Total Joint Exercises Ankle Circles/Pumps: AROM;Both;20 reps Long Arc Quad: AROM;Right;10 reps   PT Diagnosis:    PT Problem List:   PT Treatment Interventions:     PT Goals (current goals can now be found in the care plan section) Acute Rehab PT Goals Patient Stated Goal: to go home  Visit Information  Last PT Received On: 07/31/13 Assistance Needed: +1 History of Present Illness: 52 y.o. male admitted to [facility] on 07/30/13 for elective R direct anterior THA.  He is WBAT.      Subjective Data  Subjective: Reports pain at level 2/10 with no request for pain med Patient Stated Goal: to go home   Cognition  Cognition Arousal/Alertness: Awake/alert Behavior During Therapy: [facility] for tasks assessed/performed Overall Cognitive Status: Within  Functional Limits for tasks assessed    Balance  Static Sitting Balance Static Sitting - Balance Support: No upper extremity supported;Feet supported Static Sitting - Level of Assistance: 6: Modified independent (Device/Increase time) Static Standing Balance Static Standing - Balance Support: Bilateral upper extremity  supported Static Standing - Level of Assistance: 6: Modified independent (Device/Increase time)  End of Session PT - End of Session Equipment Utilized During Treatment: Gait belt Activity Tolerance: Patient tolerated treatment well Patient left: in chair;with call bell/phone within reach;with family/visitor present Nurse Communication: Mobility status;Other (comment) (need for HHPT and equipment before DC home)   GP     Ferman Hamming 07/31/2013, 10:42 AM Weldon Picking PT Acute Rehab Services [phone] Beeper [phone]

## 2013-07-31 NOTE — Plan of Care (Signed)
Problem: Consults Goal: Diagnosis- Total Joint Replacement Outcome: Completed/Met Date Met:  07/31/13 Primary Total Hip Anterior Approach

## 2013-07-31 NOTE — Progress Notes (Signed)
Patient discharge information reviewed and explained to the patient and the patients spouse. Patient stated that he understood the information that was presented and would follow up with Dr. Luiz Blare for his appointment. Jorge Mcclain

## 2013-08-02 ENCOUNTER — Encounter (HOSPITAL_COMMUNITY): Payer: Self-pay | Admitting: Orthopedic Surgery

## 2013-08-11 NOTE — Discharge Summary (Signed)
Patient ID: Jorge Mcclain MRN: [id number] DOB/AGE: 1961/10/05 52 y.o.  Admit date: 07/30/2013 Discharge date: 07/31/13 Admission Diagnoses:  Principal Problem:   Osteoarthritis of right hip   Discharge Diagnoses:  Same  Past Medical History  Diagnosis Date  . Arthritis     Surgeries: Procedure(s):Right TOTAL HIP ARTHROPLASTY ANTERIOR APPROACH on 07/30/2013   Discharged Condition: Improved  Hospital Course: Jorge Mcclain is an 52 y.o. male who was admitted 07/30/2013 for operative treatment ofOsteoarthritis of right hip. Patient has severe unremitting pain that affects sleep, daily activities, and work/hobbies. After pre-op clearance the patient was taken to the operating room on 07/30/2013 and underwent  Procedure(s):Right TOTAL HIP ARTHROPLASTY ANTERIOR APPROACH.    Patient was given perioperative antibiotics:  Anti-infectives   Start     Dose/Rate Route Frequency Ordered Stop   07/30/13 1530  ceFAZolin (ANCEF) IVPB 2 g/50 mL premix     2 g 100 mL/hr over 30 Minutes Intravenous Every 6 hours 07/30/13 1523 07/30/13 2257   07/30/13 0600  ceFAZolin (ANCEF) IVPB 2 g/50 mL premix     2 g 100 mL/hr over 30 Minutes Intravenous On call to O.R. 07/29/13 1446 07/30/13 0750       Patient was given sequential compression devices, early ambulation, and chemoprophylaxis to prevent DVT.  Patient benefited maximally from hospital stay and there were no complications.    Recent vital signs: see chart   Recent laboratory studies: see chart  Discharge Medications:     Medication List    STOP taking these medications       HYDROcodone-acetaminophen 5-325 MG per tablet  Commonly known as:  NORCO/VICODIN      TAKE these medications       aspirin EC 325 MG tablet  Take 1 tablet (325 mg total) by mouth 2 (two) times daily after a meal.     Co Q 10 100 MG Caps  Take 1 capsule by mouth daily.     Flaxseed (Linseed) 1000 MG Caps  Take 1 capsule by mouth daily.     glucosamine-chondroitin 500-400 MG tablet  Take 1 tablet by mouth daily.     multivitamin tablet  Take 1 tablet by mouth daily.     OMEGA 3 1000 MG Caps  Take 1 capsule by mouth daily.     oxyCODONE-acetaminophen 5-325 MG per tablet  Commonly known as:  PERCOCET/ROXICET  Take 1-2 tablets by mouth every 6 (six) hours as needed for pain.     simvastatin 80 MG tablet  Commonly known as:  ZOCOR  Take 80 mg by mouth at bedtime.        Diagnostic Studies: Dg Chest 2 View  07/23/2013   *RADIOLOGY REPORT*  Clinical Data: Preoperative evaluation for hip arthroplasty  CHEST - 2 VIEW  Comparison: None.  Findings: The heart and pulmonary vascularity are within normal limits.  The lungs are clear bilaterally.  No acute bony abnormality is seen.  IMPRESSION: No acute abnormality noted.   Original Report Authenticated By: Alcide Clever, M.D.   Dg Hip Operative Right  07/30/2013   *RADIOLOGY REPORT*  Clinical Data: Post right total hip replacement  DG OPERATIVE RIGHT HIP  Comparison: None.  Findings:  Two spot anterior projection intraoperative fluoroscopic images of the right hip and lower pelvis are provided for review.  Images demonstrate sequela of right total hip replacement. Alignment appears near anatomic.  There is a minimal amount of expected subcutaneous emphysema about the operative site.  No definite radio-opaque  foreign body.  IMPRESSION: Post right total hip replacement without definite evidence of complication.   Original Report Authenticated By: Tacey Ruiz, MD   Dg Pelvis Portable  07/30/2013   CLINICAL DATA:  Postop right hip.  EXAM: PORTABLE PELVIS  COMPARISON:  None.  FINDINGS: Right hip replacement. Normal AP alignment. No hardware or bony complicating feature. No acute bony abnormality. Mild degenerative changes in the left hip.  IMPRESSION: Changes of right hip replacement. No complicating feature.   Electronically Signed   By: Charlett Nose M.D.   On: 07/30/2013 12:01   Dg Hip  Portable 1 View Right  07/30/2013   *RADIOLOGY REPORT*  Clinical Data: Postop right hip the placement surgery.  PORTABLE RIGHT HIP - 1 VIEW  Comparison:  Findings: The femoral component appears well seated without complicating features.  No fractures identified.  IMPRESSION: Well seated femoral component without complicating features.   Original Report Authenticated By: Rudie Meyer, M.D.    Disposition: 01-Home or Self Care WBAT on Right. Will need HHPT at discharge.     Discharge Orders   Future Orders Complete By Expires   Call MD / Call 911  As directed    Comments:     If you experience chest pain or shortness of breath, CALL 911 and be transported to the hospital emergency room.  If you develope a fever above 101 F, pus (white drainage) or increased drainage or redness at the wound, or calf pain, call your surgeon's office.   Constipation Prevention  As directed    Comments:     Drink plenty of fluids.  Prune juice may be helpful.  You may use a stool softener, such as Colace (over the counter) 100 mg twice a day.  Use MiraLax (over the counter) for constipation as needed.   Diet - low sodium heart healthy  As directed    Increase activity slowly as tolerated  As directed       Follow-up Information   Follow up with GRAVES,JOHN L, MD. Schedule an appointment as soon as possible for a visit in 2 weeks.   Specialty:  Orthopedic Surgery   Contact information:   [address] [place] [address] [phone]       Follow up with [facility]. (Home health physical therapy and occupational therapy)    Contact information:   [address] [place] [address] [phone]        Signed: Matthew Folks 08/11/2013, 11:19 AM

## 2016-09-04 DIAGNOSIS — H5213 Myopia, bilateral: Secondary | ICD-10-CM | POA: Diagnosis not present

## 2017-05-07 DIAGNOSIS — Z1159 Encounter for screening for other viral diseases: Secondary | ICD-10-CM | POA: Diagnosis not present

## 2017-05-07 DIAGNOSIS — J069 Acute upper respiratory infection, unspecified: Secondary | ICD-10-CM | POA: Diagnosis not present

## 2017-05-07 DIAGNOSIS — Z0000 Encounter for general adult medical examination without abnormal findings: Secondary | ICD-10-CM | POA: Diagnosis not present

## 2017-05-07 DIAGNOSIS — Z79899 Other long term (current) drug therapy: Secondary | ICD-10-CM | POA: Diagnosis not present

## 2017-05-07 DIAGNOSIS — E7800 Pure hypercholesterolemia, unspecified: Secondary | ICD-10-CM | POA: Diagnosis not present

## 2017-05-07 DIAGNOSIS — M25512 Pain in left shoulder: Secondary | ICD-10-CM | POA: Diagnosis not present

## 2017-12-08 DIAGNOSIS — M7542 Impingement syndrome of left shoulder: Secondary | ICD-10-CM | POA: Diagnosis not present

## 2017-12-08 DIAGNOSIS — M25512 Pain in left shoulder: Secondary | ICD-10-CM | POA: Diagnosis not present

## 2018-04-24 DIAGNOSIS — Z8249 Family history of ischemic heart disease and other diseases of the circulatory system: Secondary | ICD-10-CM | POA: Diagnosis not present

## 2018-04-24 DIAGNOSIS — E7800 Pure hypercholesterolemia, unspecified: Secondary | ICD-10-CM | POA: Diagnosis not present

## 2018-06-05 DIAGNOSIS — Z79899 Other long term (current) drug therapy: Secondary | ICD-10-CM | POA: Diagnosis not present

## 2018-06-05 DIAGNOSIS — Z125 Encounter for screening for malignant neoplasm of prostate: Secondary | ICD-10-CM | POA: Diagnosis not present

## 2018-06-05 DIAGNOSIS — R238 Other skin changes: Secondary | ICD-10-CM | POA: Diagnosis not present

## 2018-06-05 DIAGNOSIS — E7800 Pure hypercholesterolemia, unspecified: Secondary | ICD-10-CM | POA: Diagnosis not present

## 2018-06-05 DIAGNOSIS — E663 Overweight: Secondary | ICD-10-CM | POA: Diagnosis not present

## 2018-06-05 DIAGNOSIS — Z0000 Encounter for general adult medical examination without abnormal findings: Secondary | ICD-10-CM | POA: Diagnosis not present

## 2018-06-05 DIAGNOSIS — Z8249 Family history of ischemic heart disease and other diseases of the circulatory system: Secondary | ICD-10-CM | POA: Diagnosis not present

## 2018-07-09 ENCOUNTER — Encounter: Payer: Self-pay | Admitting: Interventional Cardiology

## 2018-08-02 NOTE — Progress Notes (Signed)
Cardiology Office Note:    Date:  08/03/2018   ID:  Jorge Mcclain, DOB 1961-04-24, MRN [id number]  PCP:  Sigmund Hazel, MD  Cardiologist:  No primary care provider on file.   Referring MD: Sigmund Hazel, MD   Chief Complaint  Patient presents with  . Advice Only    Increased cardiac risk/family history CAD    History of Present Illness:    Jorge Mcclain is a 57 y.o. male with a family h/o CAD and multiple risk factors, including hyperlipidemia and male sex.  He is asymptomatic.  A 63 year old brother recently died suddenly.  Father had coronary artery disease with bypass surgery prior to the age of 96.  He has never smoked.  He has no exertional limitations.  He denies claudication.  He has had an assortment of musculoskeletal injuries requiring surgery.  Past Medical History:  Diagnosis Date  . Arthritis     Past Surgical History:  Procedure Laterality Date  . bicef reattachment Right 2011  . HERNIA REPAIR Left 1965   groin  . KNEE ARTHROSCOPY Right 2009  . SHOULDER OPEN ROTATOR CUFF REPAIR Right 1999  . TOTAL HIP ARTHROPLASTY Right 07/30/2013   Dr Luiz Blare  . TOTAL HIP ARTHROPLASTY Right 07/30/2013   Procedure: TOTAL HIP ARTHROPLASTY ANTERIOR APPROACH;  Surgeon: Harvie Junior, MD;  Location: [facility] OR;  Service: Orthopedics;  Laterality: Right;    Current Medications: Current Meds  Medication Sig  . aspirin EC 81 MG tablet Take 81 mg by mouth daily.  . Coenzyme Q10 (CO Q 10) 100 MG CAPS Take 1 capsule by mouth daily.  . cyanocobalamin 1000 MCG tablet Take 1,000 mcg by mouth daily.  . Flaxseed, Linseed, 1000 MG CAPS Take 1 capsule by mouth daily.  Marland Kitchen glucosamine-chondroitin 500-400 MG tablet Take 1 tablet by mouth daily.  . Multiple Vitamin (MULTIVITAMIN) tablet Take 1 tablet by mouth daily.  . OMEGA 3 1000 MG CAPS Take 1 capsule by mouth daily.  Marland Kitchen oxyCODONE-acetaminophen (PERCOCET/ROXICET) 5-325 MG per tablet Take 1-2 tablets by mouth every 6 (six) hours as needed for pain.    . simvastatin (ZOCOR) 80 MG tablet Take 80 mg by mouth at bedtime.     Allergies:   Patient has no known allergies.   Social History   Socioeconomic History  . Marital status: Married    Spouse name: Not on file  . Number of children: Not on file  . Years of education: Not on file  . Highest education level: Not on file  Occupational History  . Not on file  Social Needs  . Financial resource strain: Not on file  . Food insecurity:    Worry: Not on file    Inability: Not on file  . Transportation needs:    Medical: Not on file    Non-medical: Not on file  Tobacco Use  . Smoking status: Never Smoker  . Smokeless tobacco: Never Used  Substance and Sexual Activity  . Alcohol use: Yes    Comment: occasionally  . Drug use: No  . Sexual activity: Not on file  Lifestyle  . Physical activity:    Days per week: Not on file    Minutes per session: Not on file  . Stress: Not on file  Relationships  . Social connections:    Talks on phone: Not on file    Gets together: Not on file    Attends religious service: Not on file    Active member of club  or organization: Not on file    Attends meetings of clubs or organizations: Not on file    Relationship status: Not on file  Other Topics Concern  . Not on file  Social History Narrative  . Not on file     Family History: The patient's family history includes Breast cancer in his mother; Heart attack in his brother and father.  Father had MI at age 39 and CABG in his late 59s.  ROS:   Please see the history of present illness.    Muscle aches and pains otherwise no complaints.  All other systems reviewed and are negative.  EKGs/Labs/Other Studies Reviewed:    The following studies were reviewed today: Most recent laboratory data including an LDL cholesterol of 83  EKG:  EKG is  ordered today.  The ekg ordered today demonstrates bradycardia at 58 bpm.  Slight left axis deviation.  Otherwise unremarkable.  Recent Labs: No  results found for requested labs within last 8760 hours.  Recent Lipid Panel No results found for: CHOL, TRIG, HDL, CHOLHDL, VLDL, LDLCALC, LDLDIRECT  Physical Exam:    VS:  BP 116/78   Pulse (!) 58   Ht 6' (1.829 m)   Wt 221 lb 9.6 oz (100.5 kg)   BMI 30.05 kg/m     Wt Readings from Last 3 Encounters:  08/03/18 221 lb 9.6 oz (100.5 kg)  07/23/13 212 lb 15.4 oz (96.6 kg)     GEN:  Well nourished, well developed in no acute distress HEENT: Normal NECK: No JVD. LYMPHATICS: No lymphadenopathy CARDIAC: RRR, no murmur, no gallop, no edema. VASCULAR: 2+ bilateral radial pulses.  No bruits. RESPIRATORY:  Clear to auscultation without rales, wheezing or rhonchi  ABDOMEN: Soft, non-tender, non-distended, No pulsatile mass, MUSCULOSKELETAL: No deformity  SKIN: Warm and dry NEUROLOGIC:  Alert and oriented x 3 PSYCHIATRIC:  Normal affect   ASSESSMENT:    1. Cardiovascular risk factor   2. Family history of premature CAD   3. Hyperlipidemia with target LDL less than 100   4. Gilbert's syndrome    PLAN:    In order of problems listed above:  1. Patient has significant risk factors including family history and hyperlipidemia.  He does not smoke, has no symptoms, and is very active.  Plan to do an exercise treadmill test and also get a coronary calcium score lipid management should include aggressive LDL control perhaps to the level of 70 irregardless of calcium score results.  His recent lipid profile that LDL of approximately patient taking Zocor, 40 mg/day. 20. 5 year old brother died suddenly of a partial infarction.  As noted above father also had premature coronary artery disease. 3. LDL target should be at least 100 and given family history, we should probably target 70.  Continue aerobic activity.  Plant-based diet.  Coronary calcium score and exercise treadmill test to assess risk and help clarify aggressiveness of risk factor modification.   Medication Adjustments/Labs and  Tests Ordered: Current medicines are reviewed at length with the patient today.  Concerns regarding medicines are outlined above.  Orders Placed This Encounter  Procedures  . CT CARDIAC SCORING  . EXERCISE TOLERANCE TEST (ETT)  . EKG 12-Lead   No orders of the defined types were placed in this encounter.   Patient Instructions  Medication Instructions:  Your physician recommends that you continue on your current medications as directed. Please refer to the Current Medication list given to you today.   Labwork: none  Testing/Procedures: Your  physician has requested that you have an exercise tolerance test. For further information please visit [email or web address] Please also follow instruction sheet, as given.  Dr. Katrinka Blazing would like you to have a Calcium Score CT scan  Follow-Up: Your physician wants you to follow-up in: 12 months.  You will receive a reminder letter in the mail two months in advance. If you don't receive a letter, please call our office to schedule the follow-up appointment.   Any Other Special Instructions Will Be Listed Below (If Applicable).     If you need a refill on your cardiac medications before your next appointment, please call your pharmacy.      Signed, Lesleigh Noe, MD  08/03/2018 5:24 PM    [facility]

## 2018-08-03 ENCOUNTER — Ambulatory Visit (INDEPENDENT_AMBULATORY_CARE_PROVIDER_SITE_OTHER)
Admission: RE | Admit: 2018-08-03 | Discharge: 2018-08-03 | Disposition: A | Discharge status: Home / Self Care | Payer: BLUE CROSS/BLUE SHIELD | Source: Ambulatory Visit | Attending: Interventional Cardiology | Admitting: Interventional Cardiology

## 2018-08-03 ENCOUNTER — Ambulatory Visit (INDEPENDENT_AMBULATORY_CARE_PROVIDER_SITE_OTHER): Payer: BLUE CROSS/BLUE SHIELD | Admitting: Interventional Cardiology

## 2018-08-03 ENCOUNTER — Encounter: Payer: Self-pay | Admitting: Interventional Cardiology

## 2018-08-03 VITALS — BP 116/78 | HR 58 | Ht 72.0 in | Wt 221.6 lb

## 2018-08-03 DIAGNOSIS — Z9189 Other specified personal risk factors, not elsewhere classified: Secondary | ICD-10-CM

## 2018-08-03 DIAGNOSIS — Z8249 Family history of ischemic heart disease and other diseases of the circulatory system: Secondary | ICD-10-CM

## 2018-08-03 DIAGNOSIS — E785 Hyperlipidemia, unspecified: Secondary | ICD-10-CM | POA: Diagnosis not present

## 2018-08-03 NOTE — Patient Instructions (Signed)
Medication Instructions:  Your physician recommends that you continue on your current medications as directed. Please refer to the Current Medication list given to you today.   Labwork: none  Testing/Procedures: Your physician has requested that you have an exercise tolerance test. For further information please visit [email or web address] Please also follow instruction sheet, as given.  Dr. Katrinka Blazing would like you to have a Calcium Score CT scan  Follow-Up: Your physician wants you to follow-up in: 12 months.  You will receive a reminder letter in the mail two months in advance. If you don't receive a letter, please call our office to schedule the follow-up appointment.   Any Other Special Instructions Will Be Listed Below (If Applicable).     If you need a refill on your cardiac medications before your next appointment, please call your pharmacy.

## 2018-08-04 ENCOUNTER — Telehealth: Payer: Self-pay | Admitting: *Deleted

## 2018-08-04 DIAGNOSIS — Z9189 Other specified personal risk factors, not elsewhere classified: Secondary | ICD-10-CM

## 2018-08-04 DIAGNOSIS — Z8249 Family history of ischemic heart disease and other diseases of the circulatory system: Secondary | ICD-10-CM

## 2018-08-04 DIAGNOSIS — E785 Hyperlipidemia, unspecified: Secondary | ICD-10-CM

## 2018-08-04 NOTE — Telephone Encounter (Signed)
Spoke with pt and went over results and recommendations per Dr. Smith.  Pt verbalized understanding and was in agreement with this plan.  

## 2018-08-04 NOTE — Telephone Encounter (Signed)
-----   Message from Lyn Records, MD sent at 08/04/2018  2:28 PM EDT ----- Let the patient know the calcium score is 14 which is low risk. If he passes the stress test there is nothing else to do but treat lipids, exercise, keep weight down, watch sugar and BP. He actually should get an hs-CRP to exclude increased risk( we just discussed this!) Can be drawn when he comes for ETT. A copy will be sent to Sigmund Hazel, MD

## 2018-08-06 ENCOUNTER — Ambulatory Visit (INDEPENDENT_AMBULATORY_CARE_PROVIDER_SITE_OTHER): Payer: BLUE CROSS/BLUE SHIELD

## 2018-08-06 DIAGNOSIS — Z9189 Other specified personal risk factors, not elsewhere classified: Secondary | ICD-10-CM

## 2018-08-06 DIAGNOSIS — Z8249 Family history of ischemic heart disease and other diseases of the circulatory system: Secondary | ICD-10-CM | POA: Diagnosis not present

## 2018-08-06 DIAGNOSIS — E785 Hyperlipidemia, unspecified: Secondary | ICD-10-CM | POA: Diagnosis not present

## 2018-08-06 LAB — EXERCISE TOLERANCE TEST
Estimated workload: 13.7 METS
Exercise duration (min): 11 min
Exercise duration (sec): 0 s
MPHR: 163 {beats}/min
Peak HR: 155 {beats}/min
Percent HR: 95 %
RPE: 16
Rest HR: 61 {beats}/min

## 2018-11-09 DIAGNOSIS — J209 Acute bronchitis, unspecified: Secondary | ICD-10-CM | POA: Diagnosis not present

## 2018-11-09 DIAGNOSIS — R062 Wheezing: Secondary | ICD-10-CM | POA: Diagnosis not present

## 2018-12-19 DIAGNOSIS — M25552 Pain in left hip: Secondary | ICD-10-CM | POA: Diagnosis not present

## 2019-06-08 DIAGNOSIS — M25521 Pain in right elbow: Secondary | ICD-10-CM | POA: Diagnosis not present

## 2019-06-08 DIAGNOSIS — M25552 Pain in left hip: Secondary | ICD-10-CM | POA: Diagnosis not present

## 2019-09-29 DIAGNOSIS — E7800 Pure hypercholesterolemia, unspecified: Secondary | ICD-10-CM | POA: Diagnosis not present

## 2019-09-29 DIAGNOSIS — M1612 Unilateral primary osteoarthritis, left hip: Secondary | ICD-10-CM | POA: Diagnosis not present

## 2019-10-19 DIAGNOSIS — M1612 Unilateral primary osteoarthritis, left hip: Secondary | ICD-10-CM | POA: Diagnosis not present

## 2019-10-27 DIAGNOSIS — M1612 Unilateral primary osteoarthritis, left hip: Secondary | ICD-10-CM | POA: Diagnosis not present

## 2020-01-11 DIAGNOSIS — E7800 Pure hypercholesterolemia, unspecified: Secondary | ICD-10-CM | POA: Diagnosis not present

## 2020-06-30 DIAGNOSIS — Z0000 Encounter for general adult medical examination without abnormal findings: Secondary | ICD-10-CM | POA: Diagnosis not present

## 2020-06-30 DIAGNOSIS — E7800 Pure hypercholesterolemia, unspecified: Secondary | ICD-10-CM | POA: Diagnosis not present

## 2020-06-30 DIAGNOSIS — Z79899 Other long term (current) drug therapy: Secondary | ICD-10-CM | POA: Diagnosis not present

## 2020-06-30 DIAGNOSIS — Z8249 Family history of ischemic heart disease and other diseases of the circulatory system: Secondary | ICD-10-CM | POA: Diagnosis not present

## 2020-06-30 DIAGNOSIS — Z6829 Body mass index (BMI) 29.0-29.9, adult: Secondary | ICD-10-CM | POA: Diagnosis not present

## 2020-06-30 DIAGNOSIS — Z125 Encounter for screening for malignant neoplasm of prostate: Secondary | ICD-10-CM | POA: Diagnosis not present

## 2020-10-13 DIAGNOSIS — E7800 Pure hypercholesterolemia, unspecified: Secondary | ICD-10-CM | POA: Diagnosis not present

## 2020-10-13 DIAGNOSIS — Z5181 Encounter for therapeutic drug level monitoring: Secondary | ICD-10-CM | POA: Diagnosis not present

## 2021-01-16 DIAGNOSIS — M25551 Pain in right hip: Secondary | ICD-10-CM | POA: Diagnosis not present

## 2021-01-16 DIAGNOSIS — M545 Low back pain, unspecified: Secondary | ICD-10-CM | POA: Diagnosis not present

## 2021-01-16 DIAGNOSIS — M5441 Lumbago with sciatica, right side: Secondary | ICD-10-CM | POA: Diagnosis not present

## 2021-03-13 DIAGNOSIS — M9905 Segmental and somatic dysfunction of pelvic region: Secondary | ICD-10-CM | POA: Diagnosis not present

## 2021-03-13 DIAGNOSIS — M9903 Segmental and somatic dysfunction of lumbar region: Secondary | ICD-10-CM | POA: Diagnosis not present

## 2021-03-13 DIAGNOSIS — M5441 Lumbago with sciatica, right side: Secondary | ICD-10-CM | POA: Diagnosis not present

## 2021-03-13 DIAGNOSIS — M9902 Segmental and somatic dysfunction of thoracic region: Secondary | ICD-10-CM | POA: Diagnosis not present

## 2021-03-15 ENCOUNTER — Other Ambulatory Visit: Payer: Self-pay

## 2021-03-15 ENCOUNTER — Emergency Department (HOSPITAL_COMMUNITY): Payer: BC Managed Care – PPO

## 2021-03-15 ENCOUNTER — Encounter (HOSPITAL_COMMUNITY): Payer: Self-pay

## 2021-03-15 ENCOUNTER — Emergency Department (HOSPITAL_COMMUNITY)
Admission: EM | Admit: 2021-03-15 | Discharge: 2021-03-15 | Disposition: A | Discharge status: Home / Self Care | Payer: BC Managed Care – PPO | Attending: Emergency Medicine | Admitting: Emergency Medicine

## 2021-03-15 DIAGNOSIS — N281 Cyst of kidney, acquired: Secondary | ICD-10-CM | POA: Diagnosis not present

## 2021-03-15 DIAGNOSIS — M4319 Spondylolisthesis, multiple sites in spine: Secondary | ICD-10-CM | POA: Diagnosis not present

## 2021-03-15 DIAGNOSIS — R109 Unspecified abdominal pain: Secondary | ICD-10-CM | POA: Diagnosis not present

## 2021-03-15 DIAGNOSIS — N132 Hydronephrosis with renal and ureteral calculous obstruction: Secondary | ICD-10-CM | POA: Diagnosis not present

## 2021-03-15 DIAGNOSIS — N200 Calculus of kidney: Secondary | ICD-10-CM

## 2021-03-15 DIAGNOSIS — I700 Atherosclerosis of aorta: Secondary | ICD-10-CM | POA: Diagnosis not present

## 2021-03-15 DIAGNOSIS — Z7982 Long term (current) use of aspirin: Secondary | ICD-10-CM | POA: Diagnosis not present

## 2021-03-15 DIAGNOSIS — Z96641 Presence of right artificial hip joint: Secondary | ICD-10-CM | POA: Diagnosis not present

## 2021-03-15 LAB — COMPREHENSIVE METABOLIC PANEL
ALT: 43 U/L (ref 0–44)
AST: 29 U/L (ref 15–41)
Albumin: 4.4 g/dL (ref 3.5–5.0)
Alkaline Phosphatase: 74 U/L (ref 38–126)
Anion gap: 10 (ref 5–15)
BUN: 25 mg/dL — ABNORMAL HIGH (ref 6–20)
CO2: 26 mmol/L (ref 22–32)
Calcium: 9.7 mg/dL (ref 8.9–10.3)
Chloride: 106 mmol/L (ref 98–111)
Creatinine, Ser: 1.4 mg/dL — ABNORMAL HIGH (ref 0.61–1.24)
GFR, Estimated: 58 mL/min — ABNORMAL LOW (ref >60)
Glucose, Bld: 146 mg/dL — ABNORMAL HIGH (ref 70–99)
Potassium: 4.1 mmol/L (ref 3.5–5.1)
Sodium: 142 mmol/L (ref 135–145)
Total Bilirubin: 1.3 mg/dL — ABNORMAL HIGH (ref 0.3–1.2)
Total Protein: 7.8 g/dL (ref 6.5–8.1)

## 2021-03-15 LAB — URINALYSIS, ROUTINE W REFLEX MICROSCOPIC
Bilirubin Urine: NEGATIVE
Glucose, UA: NEGATIVE mg/dL
Ketones, ur: 5 mg/dL — AB
Leukocytes,Ua: NEGATIVE
Nitrite: NEGATIVE
Protein, ur: NEGATIVE mg/dL
RBC / HPF: >50 RBC/hpf — ABNORMAL HIGH (ref 0–5)
Specific Gravity, Urine: 1.024 (ref 1.005–1.030)
pH: 5 (ref 5.0–8.0)

## 2021-03-15 LAB — CBC WITH DIFFERENTIAL/PLATELET
Abs Immature Granulocytes: 0.01 10*3/uL (ref 0.00–0.07)
Basophils Absolute: 0 10*3/uL (ref 0.0–0.1)
Basophils Relative: 1 %
Eosinophils Absolute: 0.4 10*3/uL (ref 0.0–0.5)
Eosinophils Relative: 5 %
HCT: 45.7 % (ref 39.0–52.0)
Hemoglobin: 15.8 g/dL (ref 13.0–17.0)
Immature Granulocytes: 0 %
Lymphocytes Relative: 25 %
Lymphs Abs: 1.8 10*3/uL (ref 0.7–4.0)
MCH: 31 pg (ref 26.0–34.0)
MCHC: 34.6 g/dL (ref 30.0–36.0)
MCV: 89.6 fL (ref 80.0–100.0)
Monocytes Absolute: 0.4 10*3/uL (ref 0.1–1.0)
Monocytes Relative: 5 %
Neutro Abs: 4.5 10*3/uL (ref 1.7–7.7)
Neutrophils Relative %: 64 %
Platelets: 281 10*3/uL (ref 150–400)
RBC: 5.1 MIL/uL (ref 4.22–5.81)
RDW: 12 % (ref 11.5–15.5)
WBC: 7 10*3/uL (ref 4.0–10.5)
nRBC: 0 % (ref 0.0–0.2)

## 2021-03-15 MED ORDER — TAMSULOSIN HCL 0.4 MG PO CAPS: 0.4000 mg | ORAL_CAPSULE | Freq: Every day | ORAL | 0 refills | Status: AC

## 2021-03-15 MED ORDER — KETOROLAC TROMETHAMINE 30 MG/ML IJ SOLN
30.0000 mg | Freq: Once | INTRAMUSCULAR | Status: AC
  Administered 2021-03-15: 30 mg via INTRAVENOUS
  Filled 2021-03-15: qty 1

## 2021-03-15 MED ORDER — IBUPROFEN 600 MG PO TABS: 600.0000 mg | ORAL_TABLET | Freq: Four times a day (QID) | ORAL | 0 refills | Status: AC | PRN

## 2021-03-15 MED ORDER — HYDROCODONE-ACETAMINOPHEN 5-325 MG PO TABS: 1.0000 | ORAL_TABLET | Freq: Four times a day (QID) | ORAL | 0 refills | Status: AC | PRN

## 2021-03-15 MED ORDER — ONDANSETRON 4 MG PO TBDP: 4.0000 mg | ORAL_TABLET | Freq: Three times a day (TID) | ORAL | 0 refills | Status: AC | PRN

## 2021-03-15 NOTE — ED Triage Notes (Signed)
Pt complains of acute right flank pain that radiates to his navel Pt still has his appendix and has never had kidney stones Denies vomiting but is very nauseated

## 2021-03-15 NOTE — ED Triage Notes (Signed)
Emergency Medicine Provider Triage Evaluation Note  MONTREAL STEIDLE , a 60 y.o. male  was evaluated in triage.  Pt complains of flank pain.  Review of Systems  Positive: Flank pain Negative: Nausea, vomiting  Physical Exam  BP (!) 151/100 (BP Location: Left Arm)   Pulse 66   Temp 97.7 F (36.5 C) (Oral)   Resp 16   Ht 6' (1.829 m)   Wt 95.3 kg   SpO2 96%   BMI 28.48 kg/m  Gen:   Awake, no distress   HEENT:  Atraumatic  Resp:  Normal effort  Cardiac:  Normal rate  Abd:   Nondistended, nontender  MSK:   Moves extremities without difficulty  Neuro:  Speech clear   Medical Decision Making  Medically screening exam initiated at 5:55 AM.  Appropriate orders placed.  Gordy Councilman Slattery was informed that the remainder of the evaluation will be completed by another provider, this initial triage assessment does not replace that evaluation and if he leaves it would be considered AMA. Also discussed the importance of remaining in the ED until their evaluation is complete.     Srija Southard, Barbara Cower, MD 03/15/21 [phone]

## 2021-03-15 NOTE — ED Notes (Signed)
Patient transported to CT 

## 2021-03-15 NOTE — Discharge Instructions (Signed)
You have a small, 4 mm stone in your right ureter.  Your appendix was normal-appearing on CT and there were no other significant findings.  I suspect that you will continue to experience waxing and waning pain symptoms involving your right flank and the right lower quadrant of your abdomen.  Please take ibuprofen 600 mg every 6 hours as needed for pain control.  You may also take Vicodin for breakthrough symptoms.  Please note that Vicodin can make you drowsy.  I have also prescribed Zofran ODT which can take as needed for nausea symptoms.  Take the tamsulosin, as directed, to help facilitate passage of the stone.  Drink plenty of fluids.  Please follow-up with your primary care provider regarding today's ED encounter.  I would also like for you to call Dr. Retta Diones with [facility] to schedule appointment for ongoing evaluation and management.    Return to the ED or seek immediate medical attention should you experience any new or worsening symptoms.

## 2021-03-15 NOTE — ED Provider Notes (Addendum)
[facility]-EMERGENCY DEPT Provider Note   CSN: [id number] Arrival date & time: 03/15/21  0530     History No chief complaint on file.   Jorge Mcclain is a 60 y.o. male with no relevant past medical history presents the ED with complaints of right-sided flank pain.  Patient is accompanied by his wife was at bedside.  He states that approximately 3:30 AM, he woke up with 9 out of 10 right-sided flank pain.  It was sudden onset.  Denied any symptoms last night prior to going to bed.  He describes his pain as radiating into right lower quadrant.  Patient states that his pain is been waxing and waning, currently 3 out of 10.  He had nausea symptoms, but no vomiting.  Denies any dysuria, obvious hematuria, increased urinary frequency or hesitancy, fevers or chills, or any other symptoms.    HPI     Past Medical History:  Diagnosis Date  . Arthritis     Patient Active Problem List   Diagnosis Date Noted  . Osteoarthritis of right hip 07/30/2013    Past Surgical History:  Procedure Laterality Date  . bicef reattachment Right 2011  . HERNIA REPAIR Left 1965   groin  . KNEE ARTHROSCOPY Right 2009  . SHOULDER OPEN ROTATOR CUFF REPAIR Right 1999  . TOTAL HIP ARTHROPLASTY Right 07/30/2013   Dr Luiz Blare  . TOTAL HIP ARTHROPLASTY Right 07/30/2013   Procedure: TOTAL HIP ARTHROPLASTY ANTERIOR APPROACH;  Surgeon: Harvie Junior, MD;  Location: [facility] OR;  Service: Orthopedics;  Laterality: Right;       Family History  Problem Relation Age of Onset  . Breast cancer Mother   . Heart attack Father   . Heart attack Brother     Social History   Tobacco Use  . Smoking status: Never Smoker  . Smokeless tobacco: Never Used  Vaping Use  . Vaping Use: Never used  Substance Use Topics  . Alcohol use: Yes    Comment: occasionally  . Drug use: No    Home Medications Prior to Admission medications   Medication Sig Start Date End Date Taking? Authorizing Provider   HYDROcodone-acetaminophen (NORCO/VICODIN) 5-325 MG tablet Take 1 tablet by mouth every 6 (six) hours as needed for up to 10 doses for severe pain. 03/15/21  Yes Lorelee New, PA-C  ibuprofen (ADVIL) 600 MG tablet Take 1 tablet (600 mg total) by mouth every 6 (six) hours as needed. 03/15/21  Yes Lorelee New, PA-C  ondansetron (ZOFRAN ODT) 4 MG disintegrating tablet Take 1 tablet (4 mg total) by mouth every 8 (eight) hours as needed for nausea or vomiting. 03/15/21  Yes Lorelee New, PA-C  tamsulosin (FLOMAX) 0.4 MG CAPS capsule Take 1 capsule (0.4 mg total) by mouth daily for 21 days. 03/15/21 04/05/21 Yes Lorelee New, PA-C  aspirin EC 81 MG tablet Take 81 mg by mouth daily.    [provider]  Coenzyme Q10 (CO Q 10) 100 MG CAPS Take 1 capsule by mouth daily.    [provider]  cyanocobalamin 1000 MCG tablet Take 1,000 mcg by mouth daily.    [provider]  Flaxseed, Linseed, 1000 MG CAPS Take 1 capsule by mouth daily.    [provider]  glucosamine-chondroitin 500-400 MG tablet Take 1 tablet by mouth daily.    [provider]  Multiple Vitamin (MULTIVITAMIN) tablet Take 1 tablet by mouth daily.    [provider]  OMEGA 3  1000 MG CAPS Take 1 capsule by mouth daily.    [provider]  oxyCODONE-acetaminophen (PERCOCET/ROXICET) 5-325 MG per tablet Take 1-2 tablets by mouth every 6 (six) hours as needed for pain. 07/30/13   Marshia LyBethune, James, PA-C  simvastatin (ZOCOR) 80 MG tablet Take 80 mg by mouth at bedtime.    [provider]    Allergies    Patient has no known allergies.  Review of Systems   Review of Systems  All other systems reviewed and are negative.   Physical Exam Updated Vital Signs BP 126/82   Pulse (!) 56   Temp 97.7 F (36.5 C) (Oral)   Resp 18   Ht 6' (1.829 m)   Wt 95.3 kg   SpO2 97%   BMI 28.48 kg/m   Physical Exam Vitals and nursing note reviewed. Exam conducted with a  chaperone present.  Constitutional:      General: He is not in acute distress.    Appearance: Normal appearance. He is not toxic-appearing.  HENT:     Head: Normocephalic and atraumatic.  Eyes:     General: No scleral icterus.    Conjunctiva/sclera: Conjunctivae normal.  Cardiovascular:     Rate and Rhythm: Normal rate.  Pulmonary:     Effort: Pulmonary effort is normal. No respiratory distress.  Abdominal:     General: Abdomen is flat. There is no distension.     Palpations: Abdomen is soft.     Tenderness: There is no abdominal tenderness. There is no right CVA tenderness or left CVA tenderness.  Musculoskeletal:        General: Normal range of motion.  Skin:    General: Skin is dry.  Neurological:     Mental Status: He is alert and oriented to person, place, and time.     GCS: GCS eye subscore is 4. GCS verbal subscore is 5. GCS motor subscore is 6.  Psychiatric:        Mood and Affect: Mood normal.        Behavior: Behavior normal.        Thought Content: Thought content normal.     ED Results / Procedures / Treatments   Labs (all labs ordered are listed, but only abnormal results are displayed) Labs Reviewed  COMPREHENSIVE METABOLIC PANEL - Abnormal; Notable for the following components:      Result Value   Glucose, Bld 146 (*)    BUN 25 (*)    Creatinine, Ser 1.40 (*)    Total Bilirubin 1.3 (*)    GFR, Estimated 58 (*)    All other components within normal limits  URINALYSIS, ROUTINE W REFLEX MICROSCOPIC - Abnormal; Notable for the following components:   APPearance HAZY (*)    Hgb urine dipstick LARGE (*)    Ketones, ur 5 (*)    RBC / HPF >50 (*)    Bacteria, UA RARE (*)    All other components within normal limits  URINE CULTURE  CBC WITH DIFFERENTIAL/PLATELET    EKG None  Radiology CT Renal Stone Study  Result Date: 03/15/2021 CLINICAL DATA:  Flank pain with kidney stone suspected EXAM: CT ABDOMEN AND PELVIS WITHOUT CONTRAST TECHNIQUE:  Multidetector CT imaging of the abdomen and pelvis was performed following the standard protocol without IV contrast. COMPARISON:  None. FINDINGS: Lower chest: Streaky dependent opacity in the lungs attributed atelectasis. Hepatobiliary: No focal liver abnormality.No evidence of biliary obstruction or stone. Pancreas: Unremarkable. Spleen: Unremarkable. Adrenals/Urinary Tract: Negative adrenals. No hydronephrosis  or stone. 32 mm left upper pole renal cyst. Perinephric stranding which is nonspecific and symmetric. Minor right hydronephrosis due to a 4 mm stone in the right ureter at the level of L3-4. Punctate right lower pole calculus. Unremarkable bladder. Stomach/Bowel:  No obstruction. No appendicitis. Vascular/Lymphatic: No acute vascular abnormality. Atheromatous calcifications of the aorta. No mass or adenopathy. Reproductive:No pathologic findings. Other: No ascites or pneumoperitoneum. Musculoskeletal: No acute abnormalities. 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 IMPRESSION: 1. Mild right hydroureteronephrosis from a 4 mm proximal ureteric stone. 2. Punctate right renal calculus. 3.  Aortic Atherosclerosis (ICD10-I70.0). Electronically Signed   By: Marnee Spring M.D.   On: 03/15/2021 07:26    Procedures Procedures   Medications Ordered in ED Medications  ketorolac (TORADOL) 30 MG/ML injection 30 mg (30 mg Intravenous Given 03/15/21 4098)    ED Course  I have reviewed the triage vital signs and the nursing notes.  Pertinent labs & imaging results that were available during my care of the patient were reviewed by me and considered in my medical decision making (see chart for details).    MDM Rules/Calculators/A&P                          Jorge Mcclain was evaluated in Emergency Department on 03/15/2021 for the symptoms described in the history of present illness. He was evaluated in the context of the global COVID-19 pandemic, which  necessitated consideration that the patient might be at risk for infection with the SARS-CoV-2 virus that causes COVID-19. Institutional protocols and algorithms that pertain to the evaluation of patients at risk for COVID-19 are in a state of rapid change based on information released by regulatory bodies including the CDC and federal and state organizations. These policies and algorithms were followed during the patient's care in the ED.  I personally reviewed patient's medical chart and all notes from triage and staff during today's encounter. I have also ordered and reviewed all labs and imaging that I felt to be medically necessary in the evaluation of this patient's complaints and with consideration of their physical exam. If needed, translation services were available and utilized.   Patient's history and physical exam is suggestive of obstructing kidney stone.  We will proceed with CT renal stone study.  Will provide 30 mg IV Toradol for pain and inflammation.  Denies any nausea symptoms at present.  CT renal stone study demonstrates mild right-sided hydroureteronephrosis subsequent to an obstructing 4 mm proximal ureteric stone.  UA with large hematuria, but negative leukocytes and rare bacteria.  Nitrite negative.  No leukocytosis or reported fevers.  Low suspicion for infected stone.  CMP with creatinine mildly elevated at 1.4 and GFR mildly reduced at 58, but most recent labs with which to compare are from 7 years ago.  Urine culture is in process and pending.  Will prescribe patient Flomax, Zofran ODT, and a short course of narcotics to take in addition to NSAIDs as needed.  We will refer him to [facility] for ongoing evaluation and management.  Symptoms well controlled at time of discharge.  ED return precautions discussed.  Patient and wife at bedside voiced understanding and are agreeable to the plan.   Final Clinical Impression(s) / ED Diagnoses Final diagnoses:  Kidney stone     Rx / DC Orders ED Discharge Orders         Ordered    tamsulosin (FLOMAX) 0.4  MG CAPS capsule  Daily        03/15/21 0742    ibuprofen (ADVIL) 600 MG tablet  Every 6 hours PRN        03/15/21 0742    ondansetron (ZOFRAN ODT) 4 MG disintegrating tablet  Every 8 hours PRN        03/15/21 0742    HYDROcodone-acetaminophen (NORCO/VICODIN) 5-325 MG tablet  Every 6 hours PRN        03/15/21 0759           Lorelee New, PA-C 03/15/21 0759    Lorelee New, PA-C 03/15/21 0800    Lorre Nick, MD 03/20/21 1931

## 2021-03-16 LAB — URINE CULTURE: Culture: NO GROWTH

## 2021-03-21 DIAGNOSIS — M9905 Segmental and somatic dysfunction of pelvic region: Secondary | ICD-10-CM | POA: Diagnosis not present

## 2021-03-21 DIAGNOSIS — M9902 Segmental and somatic dysfunction of thoracic region: Secondary | ICD-10-CM | POA: Diagnosis not present

## 2021-03-21 DIAGNOSIS — M9903 Segmental and somatic dysfunction of lumbar region: Secondary | ICD-10-CM | POA: Diagnosis not present

## 2021-03-21 DIAGNOSIS — M5441 Lumbago with sciatica, right side: Secondary | ICD-10-CM | POA: Diagnosis not present

## 2021-03-27 DIAGNOSIS — N201 Calculus of ureter: Secondary | ICD-10-CM | POA: Diagnosis not present

## 2021-03-28 ENCOUNTER — Other Ambulatory Visit: Payer: Self-pay | Admitting: Urology

## 2021-03-30 NOTE — Progress Notes (Signed)
DUE TO COVID-19 ONLY ONE VISITOR IS ALLOWED TO COME WITH YOU AND STAY IN THE WAITING ROOM ONLY DURING PRE OP AND PROCEDURE DAY OF SURGERY. THE 1 VISITOR  MAY VISIT WITH YOU AFTER SURGERY IN YOUR PRIVATE ROOM DURING VISITING HOURS ONLY!  YOU NEED TO HAVE A COVID 19 TEST ON___5/08/2021 ____ @_______ , THIS TEST MUST BE DONE BEFORE SURGERY,  COVID TESTING SITE [address] [place] , IT IS ON THE RIGHT GOING OUT [address] APPROXIMATELY  2 MINUTES PAST [employer] ON THE RIGHT. ONCE YOUR COVID TEST IS COMPLETED,  PLEASE BEGIN THE QUARANTINE INSTRUCTIONS AS OUTLINED IN YOUR HANDOUT.                Jorge Mcclain  03/30/2021   Your procedure is scheduled on:  04/04/2021   Report to [facility] Main  Entrance   Report to admitting at     1215pm     Call this number if you have problems the morning of surgery [phone]    Remember: Do not eat food , candy gum or mints :After Midnight. You may have clear liquids from midnight until  1115 am     CLEAR LIQUID DIET   Foods Allowed                                                                       Coffee and tea, regular and decaf                              Plain Jell-O any favor except red or purple                                            Fruit ices (not with fruit pulp)                                      Iced Popsicles                                     Carbonated beverages, regular and diet                                    Cranberry, grape and apple juices Sports drinks like Gatorade Lightly seasoned clear broth or consume(fat free) Sugar, honey syrup   _____________________________________________________________________    BRUSH YOUR TEETH MORNING OF SURGERY AND RINSE YOUR MOUTH OUT, NO CHEWING GUM CANDY OR MINTS.     Take these medicines the morning of surgery with A SIP OF WATER:     None  DO NOT TAKE ANY DIABETIC MEDICATIONS DAY OF YOUR SURGERY                                You may not  have any metal on your body including hair pins and              piercings  Do not wear jewelry, make-up, lotions, powders or perfumes, deodorant             Do not wear nail polish on your fingernails.  Do not shave  48 hours prior to surgery.              Men may shave face and neck.   Do not bring valuables to the hospital. [place].  Contacts, dentures or bridgework may not be worn into surgery.  Leave suitcase in the car. After surgery it may be brought to your room.     Patients discharged the day of surgery will not be allowed to drive home. IF YOU ARE HAVING SURGERY AND GOING HOME THE SAME DAY, YOU MUST HAVE AN ADULT TO DRIVE YOU HOME AND BE WITH YOU FOR 24 HOURS. YOU MAY GO HOME BY TAXI OR UBER OR ORTHERWISE, BUT AN ADULT MUST ACCOMPANY YOU HOME AND STAY WITH YOU FOR 24 HOURS.  Name and phone number of your driver:  Special Instructions: N/A              Please read over the following fact sheets you were given: _____________________________________________________________________  [facility] - Preparing for Surgery Before surgery, you can play an important role.  Because skin is not sterile, your skin needs to be as free of germs as possible.  You can reduce the number of germs on your skin by washing with CHG (chlorahexidine gluconate) soap before surgery.  CHG is an antiseptic cleaner which kills germs and bonds with the skin to continue killing germs even after washing. Please DO NOT use if you have an allergy to CHG or antibacterial soaps.  If your skin becomes reddened/irritated stop using the CHG and inform your nurse when you arrive at Short Stay. Do not shave (including legs and underarms) for at least 48 hours prior to the first CHG shower.  You may shave your face/neck. Please follow these instructions carefully:  1.  Shower with CHG Soap the night before surgery and the  morning of Surgery.  2.  If you  choose to wash your hair, wash your hair first as usual with your  normal  shampoo.  3.  After you shampoo, rinse your hair and body thoroughly to remove the  shampoo.                           4.  Use CHG as you would any other liquid soap.  You can apply chg directly  to the skin and wash                       Gently with a scrungie or clean washcloth.  5.  Apply the CHG Soap to your body ONLY FROM THE NECK DOWN.   Do not use on face/ open                           Wound or open sores. Avoid contact with eyes, ears mouth and genitals (private parts).  Wash face,  Genitals (private parts) with your normal soap.             6.  Wash thoroughly, paying special attention to the area where your surgery  will be performed.  7.  Thoroughly rinse your body with warm water from the neck down.  8.  DO NOT shower/wash with your normal soap after using and rinsing off  the CHG Soap.                9.  Pat yourself dry with a clean towel.            10.  Wear clean pajamas.            11.  Place clean sheets on your bed the night of your first shower and do not  sleep with pets. Day of Surgery : Do not apply any lotions/deodorants the morning of surgery.  Please wear clean clothes to the hospital/surgery center.  FAILURE TO FOLLOW THESE INSTRUCTIONS MAY RESULT IN THE CANCELLATION OF YOUR SURGERY PATIENT SIGNATURE_________________________________  NURSE SIGNATURE__________________________________  ________________________________________________________________________

## 2021-04-03 ENCOUNTER — Encounter (HOSPITAL_COMMUNITY)
Admission: RE | Admit: 2021-04-03 | Discharge: 2021-04-03 | Disposition: A | Discharge status: Home / Self Care | Payer: BC Managed Care – PPO | Source: Ambulatory Visit | Attending: Urology | Admitting: Urology

## 2021-04-03 ENCOUNTER — Encounter (HOSPITAL_COMMUNITY): Payer: Self-pay

## 2021-04-03 ENCOUNTER — Other Ambulatory Visit (HOSPITAL_COMMUNITY)
Admission: RE | Admit: 2021-04-03 | Discharge: 2021-04-03 | Disposition: A | Discharge status: Home / Self Care | Payer: BC Managed Care – PPO | Source: Ambulatory Visit | Attending: Urology | Admitting: Urology

## 2021-04-03 ENCOUNTER — Other Ambulatory Visit: Payer: Self-pay

## 2021-04-03 DIAGNOSIS — N201 Calculus of ureter: Secondary | ICD-10-CM | POA: Diagnosis not present

## 2021-04-03 DIAGNOSIS — Z01812 Encounter for preprocedural laboratory examination: Secondary | ICD-10-CM | POA: Insufficient documentation

## 2021-04-03 DIAGNOSIS — Z79899 Other long term (current) drug therapy: Secondary | ICD-10-CM | POA: Diagnosis not present

## 2021-04-03 DIAGNOSIS — Z20822 Contact with and (suspected) exposure to covid-19: Secondary | ICD-10-CM | POA: Insufficient documentation

## 2021-04-03 DIAGNOSIS — N23 Unspecified renal colic: Secondary | ICD-10-CM | POA: Diagnosis not present

## 2021-04-03 DIAGNOSIS — Z7982 Long term (current) use of aspirin: Secondary | ICD-10-CM | POA: Diagnosis not present

## 2021-04-03 HISTORY — DX: Personal history of urinary calculi: Z87.442

## 2021-04-03 LAB — CBC
HCT: 42.5 % (ref 39.0–52.0)
Hemoglobin: 14.5 g/dL (ref 13.0–17.0)
MCH: 30.4 pg (ref 26.0–34.0)
MCHC: 34.1 g/dL (ref 30.0–36.0)
MCV: 89.1 fL (ref 80.0–100.0)
Platelets: 327 10*3/uL (ref 150–400)
RBC: 4.77 MIL/uL (ref 4.22–5.81)
RDW: 11.8 % (ref 11.5–15.5)
WBC: 7 10*3/uL (ref 4.0–10.5)
nRBC: 0 % (ref 0.0–0.2)

## 2021-04-03 LAB — BASIC METABOLIC PANEL
Anion gap: 8 (ref 5–15)
BUN: 17 mg/dL (ref 6–20)
CO2: 28 mmol/L (ref 22–32)
Calcium: 9.6 mg/dL (ref 8.9–10.3)
Chloride: 105 mmol/L (ref 98–111)
Creatinine, Ser: 1.31 mg/dL — ABNORMAL HIGH (ref 0.61–1.24)
GFR, Estimated: >60 mL/min (ref >60)
Glucose, Bld: 120 mg/dL — ABNORMAL HIGH (ref 70–99)
Potassium: 4 mmol/L (ref 3.5–5.1)
Sodium: 141 mmol/L (ref 135–145)

## 2021-04-03 LAB — SARS CORONAVIRUS 2 (TAT 6-24 HRS): SARS Coronavirus 2: NEGATIVE

## 2021-04-03 NOTE — Progress Notes (Signed)
Anesthesia Review:  PCP: DR Hyacinth Meeker with [facility]  Cardiologist : none  Chest x-ray : EKG : Echo : Stress test: Cardiac Cath :  Activity level: can do a flight of stairs without difficulty  Sleep Study/ CPAP : none  Fasting Blood Sugar :      / Checks Blood Sugar -- times a day:   Blood Thinner/ Instructions /Last Dose: ASA / Instructions/ Last Dose :

## 2021-04-03 NOTE — Progress Notes (Signed)
Jorge Mcclain spoke with patient concerning surgical time change. Patient to come in at 730am. Nothing to eat or drink after midnight

## 2021-04-03 NOTE — Progress Notes (Signed)
Pt surgery for 04-04-2021 has moved from [facility] to [facility].  Pt had PAT appointment with nurse this morning.  Pt has lab work done today, results in epic.  Called and spoke w/ pt via phone , he verbalized understanding he is to arrive at [facility] at 1230 be npo after mn w/ exception clear liquids until 1130, same instructions given.  Pt had no further questions.

## 2021-04-04 ENCOUNTER — Encounter (HOSPITAL_BASED_OUTPATIENT_CLINIC_OR_DEPARTMENT_OTHER): Payer: Self-pay | Admitting: Urology

## 2021-04-04 ENCOUNTER — Ambulatory Visit (HOSPITAL_BASED_OUTPATIENT_CLINIC_OR_DEPARTMENT_OTHER): Payer: BC Managed Care – PPO | Admitting: Anesthesiology

## 2021-04-04 ENCOUNTER — Ambulatory Visit (HOSPITAL_BASED_OUTPATIENT_CLINIC_OR_DEPARTMENT_OTHER)
Admission: RE | Admit: 2021-04-04 | Discharge: 2021-04-04 | Disposition: A | Discharge status: Home / Self Care | Payer: BC Managed Care – PPO | Attending: Urology | Admitting: Urology

## 2021-04-04 ENCOUNTER — Encounter (HOSPITAL_BASED_OUTPATIENT_CLINIC_OR_DEPARTMENT_OTHER)
Admission: RE | Disposition: A | Discharge status: Home / Self Care | Payer: Self-pay | Source: Home / Self Care | Attending: Urology

## 2021-04-04 ENCOUNTER — Ambulatory Visit (HOSPITAL_BASED_OUTPATIENT_CLINIC_OR_DEPARTMENT_OTHER): Payer: BC Managed Care – PPO | Admitting: Physician Assistant

## 2021-04-04 DIAGNOSIS — Z7982 Long term (current) use of aspirin: Secondary | ICD-10-CM | POA: Insufficient documentation

## 2021-04-04 DIAGNOSIS — N23 Unspecified renal colic: Secondary | ICD-10-CM | POA: Diagnosis not present

## 2021-04-04 DIAGNOSIS — Z20822 Contact with and (suspected) exposure to covid-19: Secondary | ICD-10-CM | POA: Diagnosis not present

## 2021-04-04 DIAGNOSIS — Z79899 Other long term (current) drug therapy: Secondary | ICD-10-CM | POA: Diagnosis not present

## 2021-04-04 DIAGNOSIS — N201 Calculus of ureter: Secondary | ICD-10-CM | POA: Diagnosis not present

## 2021-04-04 DIAGNOSIS — M1611 Unilateral primary osteoarthritis, right hip: Secondary | ICD-10-CM | POA: Diagnosis not present

## 2021-04-04 HISTORY — PX: CYSTOSCOPY/URETEROSCOPY/HOLMIUM LASER/STENT PLACEMENT: SHX6546

## 2021-04-04 SURGERY — CYSTOSCOPY/URETEROSCOPY/HOLMIUM LASER/STENT PLACEMENT
Anesthesia: General | Site: Pelvis | Laterality: Right

## 2021-04-04 MED ORDER — CEFAZOLIN SODIUM-DEXTROSE 2-4 GM/100ML-% IV SOLN
2.0000 g | Freq: Once | INTRAVENOUS | Status: AC
  Administered 2021-04-04: 2 g via INTRAVENOUS

## 2021-04-04 MED ORDER — LACTATED RINGERS IV SOLN: INTRAVENOUS | Status: DC

## 2021-04-04 MED ORDER — OXYCODONE HCL 5 MG/5ML PO SOLN: 5.0000 mg | Freq: Once | ORAL | Status: DC | PRN

## 2021-04-04 MED ORDER — MIDAZOLAM HCL 5 MG/5ML IJ SOLN
INTRAMUSCULAR | Status: DC | PRN
  Administered 2021-04-04: 2 mg via INTRAVENOUS

## 2021-04-04 MED ORDER — FENTANYL CITRATE (PF) 100 MCG/2ML IJ SOLN
INTRAMUSCULAR | Status: AC
  Filled 2021-04-04: qty 2

## 2021-04-04 MED ORDER — MIDAZOLAM HCL 2 MG/2ML IJ SOLN
INTRAMUSCULAR | Status: AC
  Filled 2021-04-04: qty 2

## 2021-04-04 MED ORDER — CEFAZOLIN SODIUM-DEXTROSE 2-4 GM/100ML-% IV SOLN
INTRAVENOUS | Status: AC
  Filled 2021-04-04: qty 100

## 2021-04-04 MED ORDER — FENTANYL CITRATE (PF) 100 MCG/2ML IJ SOLN
INTRAMUSCULAR | Status: DC | PRN
  Administered 2021-04-04 (×2): 50 ug via INTRAVENOUS

## 2021-04-04 MED ORDER — ACETAMINOPHEN 10 MG/ML IV SOLN: 1000.0000 mg | Freq: Once | INTRAVENOUS | Status: DC | PRN

## 2021-04-04 MED ORDER — IOHEXOL 300 MG/ML  SOLN
INTRAMUSCULAR | Status: DC | PRN
  Administered 2021-04-04: 10 mL via URETHRAL

## 2021-04-04 MED ORDER — FENTANYL CITRATE (PF) 100 MCG/2ML IJ SOLN: 25.0000 ug | INTRAMUSCULAR | Status: DC | PRN

## 2021-04-04 MED ORDER — PROPOFOL 10 MG/ML IV BOLUS
INTRAVENOUS | Status: DC | PRN
  Administered 2021-04-04: 150 mg via INTRAVENOUS
  Administered 2021-04-04: 50 mg via INTRAVENOUS

## 2021-04-04 MED ORDER — PROMETHAZINE HCL 25 MG/ML IJ SOLN: 6.2500 mg | INTRAMUSCULAR | Status: DC | PRN

## 2021-04-04 MED ORDER — CHLORHEXIDINE GLUCONATE 0.12 % MT SOLN: 15.0000 mL | Freq: Once | OROMUCOSAL | Status: DC

## 2021-04-04 MED ORDER — KETOROLAC TROMETHAMINE 30 MG/ML IJ SOLN
INTRAMUSCULAR | Status: DC | PRN
  Administered 2021-04-04: 30 mg via INTRAVENOUS

## 2021-04-04 MED ORDER — ORAL CARE MOUTH RINSE: 15.0000 mL | Freq: Once | OROMUCOSAL | Status: DC

## 2021-04-04 MED ORDER — ONDANSETRON HCL 4 MG/2ML IJ SOLN
INTRAMUSCULAR | Status: DC | PRN
  Administered 2021-04-04: 4 mg via INTRAVENOUS

## 2021-04-04 MED ORDER — LIDOCAINE 2% (20 MG/ML) 5 ML SYRINGE
INTRAMUSCULAR | Status: AC
  Filled 2021-04-04: qty 5

## 2021-04-04 MED ORDER — SODIUM CHLORIDE 0.9 % IR SOLN
Status: DC | PRN
  Administered 2021-04-04: 3000 mL

## 2021-04-04 MED ORDER — DEXAMETHASONE SODIUM PHOSPHATE 4 MG/ML IJ SOLN
INTRAMUSCULAR | Status: DC | PRN
  Administered 2021-04-04: 5 mg via INTRAVENOUS

## 2021-04-04 MED ORDER — OXYCODONE HCL 5 MG PO TABS: 5.0000 mg | ORAL_TABLET | Freq: Once | ORAL | Status: DC | PRN

## 2021-04-04 MED ORDER — DEXAMETHASONE SODIUM PHOSPHATE 10 MG/ML IJ SOLN
INTRAMUSCULAR | Status: AC
  Filled 2021-04-04: qty 1

## 2021-04-04 MED ORDER — LIDOCAINE 2% (20 MG/ML) 5 ML SYRINGE
INTRAMUSCULAR | Status: DC | PRN
  Administered 2021-04-04: 60 mg via INTRAVENOUS

## 2021-04-04 MED ORDER — ONDANSETRON HCL 4 MG/2ML IJ SOLN
INTRAMUSCULAR | Status: AC
  Filled 2021-04-04: qty 2

## 2021-04-04 SURGICAL SUPPLY — 27 items
APL SKNCLS STERI-STRIP NONHPOA (GAUZE/BANDAGES/DRESSINGS) ×1
BAG DRAIN URO-CYSTO SKYTR STRL (DRAIN) ×2 IMPLANT
BAG DRN UROCATH (DRAIN) ×1
BASKET STONE 1.7 NGAGE (UROLOGICAL SUPPLIES) IMPLANT
BASKET ZERO TIP NITINOL 2.4FR (BASKET) IMPLANT
BENZOIN TINCTURE PRP APPL 2/3 (GAUZE/BANDAGES/DRESSINGS) ×2 IMPLANT
BSKT STON RTRVL ZERO TP 2.4FR (BASKET)
CATH URET 5FR 28IN OPEN ENDED (CATHETERS) ×2 IMPLANT
CLOTH BEACON ORANGE TIMEOUT ST (SAFETY) ×2 IMPLANT
FIBER LASER FLEXIVA 365 (UROLOGICAL SUPPLIES) IMPLANT
GLOVE SURG ENC MOIS LTX SZ7.5 (GLOVE) ×2 IMPLANT
GOWN STRL REUS W/TWL XL LVL3 (GOWN DISPOSABLE) ×2 IMPLANT
GUIDEWIRE STR DUAL SENSOR (WIRE) IMPLANT
GUIDEWIRE ZIPWRE .038 STRAIGHT (WIRE) ×2 IMPLANT
INFUSOR MANOMETER BAG 3000ML (MISCELLANEOUS) ×2 IMPLANT
IV NS IRRIG 3000ML ARTHROMATIC (IV SOLUTION) ×4 IMPLANT
KIT TURNOVER CYSTO (KITS) ×2 IMPLANT
MANIFOLD NEPTUNE II (INSTRUMENTS) ×2 IMPLANT
NS IRRIG 500ML POUR BTL (IV SOLUTION) ×2 IMPLANT
PACK CYSTO (CUSTOM PROCEDURE TRAY) ×2 IMPLANT
STENT URET 6FRX24 CONTOUR (STENTS) ×2 IMPLANT
STRIP CLOSURE SKIN 1/2X4 (GAUZE/BANDAGES/DRESSINGS) ×2 IMPLANT
SYR 10ML LL (SYRINGE) ×2 IMPLANT
TRACTIP FLEXIVA PULS ID 200XHI (Laser) IMPLANT
TRACTIP FLEXIVA PULSE ID 200 (Laser)
TUBE CONNECTING 12X1/4 (SUCTIONS) ×2 IMPLANT
TUBING UROLOGY SET (TUBING) IMPLANT

## 2021-04-04 NOTE — Op Note (Signed)
Operative Note  Preoperative diagnosis:  1.  4 mm right distal ureteral calculus  Postoperative diagnosis: 1.  Same  Procedure(s): 1.  Cystoscopy with right ureteroscopy, manipulation/removal of ureteral stone and right ureteral stent placement 2.  Right retrograde pyelogram with intraoperative interpretation of fluoroscopic imaging  Surgeon: Rhoderick Moody, MD  Assistants:  None  Anesthesia:  General  Complications:  None  EBL: Less than 5 mL  Specimens: 1.  Right ureteral stone  Drains/Catheters: 1.  Right 6 French, 24 cm JJ stent with tether  Intraoperative findings:   1. Right distal ureteral calculus 2. Solitary right collecting system with no filling defects or dilation involving the right ureter or right renal pelvis was seen on retrograde pyelogram   Indication:  Jorge Mcclain is a 60 y.o. male who was found to have a 4 mm right proximal ureteral calculus on CT from 03/15/2021.  He was subsequently seen in the office on 03/27/2021 due to persistent right-sided flank pain.  KUB from 04/16/2021 revealed that the stone had migrated to the distal aspects of the right ureter.  He is here today for definitive stone treatment.  He has been consented for the above procedures, voices understanding and wishes to proceed.  Description of procedure:  After informed consent was obtained, the patient was brought to the operating room and general LMA anesthesia was administered. The patient was then placed in the dorsolithotomy position and prepped and draped in the usual sterile fashion. A timeout was performed. A 23 French rigid cystoscope was then inserted into the urethral meatus and advanced into the bladder under direct vision. A complete bladder survey revealed no intravesical pathology.  A 5 French ureteral catheter was then inserted into the right ureteral orifice and a retrograde pyelogram was obtained, with the findings listed above.  A Glidewire was then used to intubate  the lumen of the ureteral catheter and was advanced up to the right renal pelvis, under fluoroscopic guidance.  The catheter was then removed, leaving the wire in place.  A semirigid ureteroscope was then advanced into the distal aspects of the right ureter.  The stone was immediately identified.  Upon retracting the ureteroscope, the stone was expelled from the ureter.  I advanced the scope up to the proximal aspects of the right ureter and found no abnormalities.  The semirigid ureteroscope was then removed.  I then placed a 6 [place], 24 cm JJ stent with tether over the wire and into good position within the right collecting system.  The patient's bladder was drained and the stone debris was removed.  The tether the stent was secured to the dorsal aspect of the penis with benzoin and Steri-Strips.  He tolerated the procedure well and was transferred to the postanesthesia unit in stable condition.  Plan: The patient has been instructed to remove his stent on 04/06/2021.

## 2021-04-04 NOTE — Discharge Instructions (Signed)
CYSTOSCOPY HOME CARE INSTRUCTIONS  Activity: Rest for the remainder of the day.  Do not drive or operate equipment today.  You may resume normal activities in one to two days as instructed by your physician.   Meals: Drink plenty of liquids and eat light foods such as gelatin or soup this evening.  You may return to a normal meal plan tomorrow.  Return to Work: You may return to work in one to two days or as instructed by your physician.  Special Instructions / Symptoms: Call your physician if any of these symptoms occur:   -persistent or heavy bleeding  -bleeding which continues after first few urination  -large blood clots that are difficult to pass  -urine stream diminishes or stops completely  -fever equal to or higher than 101 degrees Farenheit.  -cloudy urine with a strong, foul odor  -severe pain  Females should always wipe from front to back after elimination.  You may feel some burning pain when you urinate.  This should disappear with time.  Applying moist heat to the lower abdomen or a hot tub bath may help relieve the pain. \  Follow-Up / Date of Return Visit to Your Physician:  as instructed Call for an appointment to arrange follow-up.   Post Anesthesia Home Care Instructions  Activity: Get plenty of rest for the remainder of the day. A responsible individual must stay with you for 24 hours following the procedure.  For the next 24 hours, DO NOT: -Drive a car -Operate machinery -Drink alcoholic beverages -Take any medication unless instructed by your physician -Make any legal decisions or sign important papers.  Meals: Start with liquid foods such as gelatin or soup. Progress to regular foods as tolerated. Avoid greasy, spicy, heavy foods. If nausea and/or vomiting occur, drink only clear liquids until the nausea and/or vomiting subsides. Call your physician if vomiting continues.  Special Instructions/Symptoms: Your throat may feel dry or sore from the  anesthesia or the breathing tube placed in your throat during surgery. If this causes discomfort, gargle with warm salt water. The discomfort should disappear within 24 hours.  If you had a scopolamine patch placed behind your ear for the management of post- operative nausea and/or vomiting:  1. The medication in the patch is effective for 72 hours, after which it should be removed.  Wrap patch in a tissue and discard in the trash. Wash hands thoroughly with soap and water. 2. You may remove the patch earlier than 72 hours if you experience unpleasant side effects which may include dry mouth, dizziness or visual disturbances. 3. Avoid touching the patch. Wash your hands with soap and water after contact with the patch.     

## 2021-04-04 NOTE — Transfer of Care (Signed)
Immediate Anesthesia Transfer of Care Note  Patient: Jorge Mcclain  Procedure(s) Performed: CYSTOSCOPY/RETROGRADE/URETEROSCOPY//STENT PLACEMENT (Right Pelvis)  Patient Location: PACU  Anesthesia Type:General  Level of Consciousness: drowsy  Airway & Oxygen Therapy: Patient Spontanous Breathing and Patient connected to face mask oxygen  Post-op Assessment: Report given to RN and Post -op Vital signs reviewed and stable  Post vital signs: Reviewed and stable  Last Vitals:  Vitals Value Taken Time  BP 120/71 04/04/21 1032  Temp    Pulse 45 04/04/21 1034  Resp 10 04/04/21 1034  SpO2 99 % 04/04/21 1034  Vitals shown include unvalidated device data.  Last Pain:  Vitals:   04/04/21 0802  TempSrc: Oral  PainSc: 0-No pain         Complications: No complications documented.

## 2021-04-04 NOTE — Anesthesia Postprocedure Evaluation (Signed)
Anesthesia Post Note  Patient: Jorge Mcclain  Procedure(s) Performed: CYSTOSCOPY/RETROGRADE/URETEROSCOPY//STENT PLACEMENT (Right Pelvis)     Patient location during evaluation: PACU Anesthesia Type: General Level of consciousness: awake and alert Pain management: pain level controlled Vital Signs Assessment: post-procedure vital signs reviewed and stable Respiratory status: spontaneous breathing, nonlabored ventilation, respiratory function stable and patient connected to nasal cannula oxygen Cardiovascular status: blood pressure returned to baseline and stable Postop Assessment: no apparent nausea or vomiting Anesthetic complications: no   No complications documented.  Last Vitals:  Vitals:   04/04/21 1045 04/04/21 1130  BP: (!) 97/57 130/86  Pulse: (!) 57 (!) 55  Resp: 14 16  Temp:  36.6 C  SpO2: 97% 98%    Last Pain:  Vitals:   04/04/21 1130  TempSrc:   PainSc: 2                  Mellody Dance

## 2021-04-04 NOTE — Anesthesia Procedure Notes (Signed)
Procedure Name: LMA Insertion Date/Time: 04/04/2021 9:59 AM Performed by: Caren Macadam, CRNA Pre-anesthesia Checklist: Patient identified, Emergency Drugs available, Suction available and Patient being monitored Patient Re-evaluated:Patient Re-evaluated prior to induction Oxygen Delivery Method: Circle system utilized Preoxygenation: Pre-oxygenation with 100% oxygen Induction Type: IV induction Ventilation: Mask ventilation without difficulty LMA: LMA inserted LMA Size: 5.0 Number of attempts: 1 Placement Confirmation: positive ETCO2 and breath sounds checked- equal and bilateral Tube secured with: Tape Dental Injury: Teeth and Oropharynx as per pre-operative assessment

## 2021-04-04 NOTE — Anesthesia Preprocedure Evaluation (Addendum)
Anesthesia Evaluation  Patient identified by MRN, date of birth, ID band Patient awake    Reviewed: Allergy & Precautions, NPO status , Patient's Chart, lab work & pertinent test results  History of Anesthesia Complications Negative for: history of anesthetic complications  Airway Mallampati: II  TM Distance: >3 FB Neck ROM: Full    Dental no notable dental hx.    Pulmonary neg pulmonary ROS,    Pulmonary exam normal breath sounds clear to auscultation       Cardiovascular Exercise Tolerance: Good negative cardio ROS Normal cardiovascular exam Rhythm:Regular Rate:Normal     Neuro/Psych negative neurological ROS  negative psych ROS   GI/Hepatic negative GI ROS, Neg liver ROS,   Endo/Other  negative endocrine ROS  Renal/GU Renal disease (kidney stones)  negative genitourinary   Musculoskeletal  (+) Arthritis , Osteoarthritis,    Abdominal Normal abdominal exam  (+)   Peds negative pediatric ROS (+)  Hematology negative hematology ROS (+)   Anesthesia Other Findings   Reproductive/Obstetrics negative OB ROS                            Anesthesia Physical Anesthesia Plan  ASA: II  Anesthesia Plan: General   Post-op Pain Management:    Induction: Intravenous  PONV Risk Score and Plan: 2 and Treatment may vary due to age or medical condition and Ondansetron  Airway Management Planned: LMA  Additional Equipment: None  Intra-op Plan:   Post-operative Plan: Extubation in OR  Informed Consent: I have reviewed the patients History and Physical, chart, labs and discussed the procedure including the risks, benefits and alternatives for the proposed anesthesia with the patient or authorized representative who has indicated his/her understanding and acceptance.     Dental advisory given  Plan Discussed with: CRNA and Anesthesiologist  Anesthesia Plan Comments:         Anesthesia Quick Evaluation

## 2021-04-04 NOTE — H&P (Signed)
PRE-OP H&P  Office Visit Report     03/27/2021   Jorge Mcclain  MRN: [id number]  DOB: Jun 21, 1961, 60 year old Male  SSN:    PRIMARY CARE:  Sigmund Hazel, MD  REFERRING:    PROVIDER:  Berniece Salines, M.D.  TREATING:  Rhoderick Moody, M.D.  LOCATION:  [facility] [phone]     --------------------------------------------------------------------------------   CC/HPI: Right flank pain   The patient is a 60 year old male who was seen in the emergency department on 03/15/2021 due to acute right-sided flank pain. He was found to have a 4 mm right mid ureteral calculus associated with mild hydronephrosis.   Labs 03/15/2021: Serum creatinine 1.4. WBC 7.0. Urine culture was negative.   -No prior history of kidney stones  -He reports intermittent episodes of right flank pain that radiates across his abdomen--he rates the pain 10/10  -Taking hydrocodone PRN with marginal improvement in his pain  -Intermittent waves of nausea, but denies vomiting  -Denies fever/chills, dysuria or hematuria     ALLERGIES: NKDA    MEDICATIONS: Aspirin Ec 81 mg tablet, delayed release  Atorvastatin Calcium  Multivitamin  Vitamin B Complex  Vitamin C  Vitamin D3  Vitamin E  Zinc     GU PSH: None   NON-GU PSH: Hip Replacement, Bilateral Shoulder Arthroscopy/surgery     GU PMH: None   NON-GU PMH: Hypercholesterolemia    FAMILY HISTORY: 1 Daughter - Daughter 1 son - Son   SOCIAL HISTORY: Marital Status: Married Preferred Language: English; Ethnicity: Not Hispanic Or Latino; Race: White Current Smoking Status: Patient has never smoked.   Tobacco Use Assessment Completed: Used Tobacco in last 30 days? Light Drinker.  Drinks 3 caffeinated drinks per day.    REVIEW OF SYSTEMS:    GU Review Male:   Patient denies frequent urination, hard to postpone urination, burning/ pain with urination, get up at night to urinate, leakage of urine, stream starts and stops, trouble  starting your stream, have to strain to urinate , erection problems, and penile pain.  Gastrointestinal (Upper):   Patient denies nausea, vomiting, and indigestion/ heartburn.  Gastrointestinal (Lower):   Patient denies diarrhea and constipation.  Constitutional:   Patient denies fever, night sweats, weight loss, and fatigue.  Skin:   Patient denies skin rash/ lesion and itching.  Eyes:   Patient denies blurred vision and double vision.  Ears/ Nose/ Throat:   Patient denies sore throat and sinus problems.  Hematologic/Lymphatic:   Patient denies swollen glands and easy bruising.  Cardiovascular:   Patient denies leg swelling and chest pains.  Respiratory:   Patient denies cough and shortness of breath.  Endocrine:   Patient denies excessive thirst.  Musculoskeletal:   Patient denies joint pain and back pain.  Neurological:   Patient denies headaches and dizziness.  Psychologic:   Patient denies depression and anxiety.   VITAL SIGNS:      03/27/2021 09:39 AM  Weight 208 lb / 94.35 kg  Height 72 in / 182.88 cm  BP 150/88 mmHg  Pulse 68 /min  Temperature 97.5 F / 36.3 C  BMI 28.2 kg/m   PAST DATA REVIEW: None   PROCEDURES:         KUB - 27253  A single view of the abdomen is obtained.      . Patient confirmed No Neulasta OnPro Device.   4 mm calcification seen along the expected course of the right distal ureter. No other calcifications are seen along  the expected course of the left ureter or within either renal shadow. No bladder stones are appreciated. No bony or bowel abnormalities are seen.          Renal Ultrasound - 88891  Right Kidney: Length: 12.7 cm Depth: 5.5 cm Cortical Width:1.2 cm Width:7.4 cm  Left Kidney: Length: 12.9 cm Depth: 5.9 cm Cortical Width:1.1 cm Width:5.4 cm  Left Kidney/Ureter:  There is no evidence of calcifications noted, the cyst noted on recent CT - was unable to visualize on [place] due to overlying bowel  Right Kidney/Ureter:  There is not  evidence of hydro noted however there is a single 4.7 mm calc noted mid/upper pole.  Bladder:  Volume 311 ml      ..neu          Urinalysis w/Scope Dipstick Dipstick Cont'd Micro  Color: Yellow Bilirubin: Neg mg/dL WBC/hpf: 0 - 5/hpf  Appearance: Clear Ketones: Trace mg/dL RBC/hpf: 0 - 2/hpf  Specific Gravity: 1.015 Blood: Trace ery/uL Bacteria: NS (Not Seen)  pH: 5.5 Protein: Neg mg/dL Cystals: NS (Not Seen)  Glucose: Neg mg/dL Urobilinogen: 0.2 mg/dL Casts: NS (Not Seen)    Nitrites: Neg Trichomonas: Not Present    Leukocyte Esterase: Neg leu/uL Mucous: Not Present      Epithelial Cells: NS (Not Seen)      Yeast: NS (Not Seen)      Sperm: Not Present         Morphine 4mg  - [id number], Qty: 4 Adm. By: [id number]  Unit: mg Lot No Lyndal Rainbow  Route: IM Exp. Date 04/25/2022  Freq: None Mfgr.:   Site: Right Buttock   ASSESSMENT:      ICD-10 Details  1 GU:   Ureteral calculus - N20.1 Right, Acute, Uncomplicated  2   Renal colic - N23 Right, Acute, Uncomplicated     PLAN:            Medications New Meds: Percocet 7.5 mg-325 mg tablet 1 tablet PO Q 4 H PRN   #20  0 Refill(s)  Tamsulosin Hcl 0.4 mg capsule 1 capsule PO Daily   #30  1 Refill(s)            Orders Labs BMP(Stat), CBC with Diff(Stat)  X-Rays: KUB    Renal Ultrasound          Schedule Return Visit/Planned Activity: 1 Week - Schedule Surgery          Document Letter(s):  Created for Patient: Clinical Summary         Notes:   - KUB today shows a 4 mm calculus along the expected course of the right distal ureter, likely representing the obstructing stone seen on recent CT.  -Renal ultrasound shows no clear evidence of hydronephrosis, but could potentially show a small nonobstructing calculus in the right renal pelvis.   -The risks, benefits and alternatives of cystoscopy with RIGHT ureteroscopy, laser lithotripsy and ureteral stent placement was discussed the patient. Risks included, but are not limited  to: bleeding, urinary tract infection, ureteral injury/avulsion, ureteral stricture formation, retained stone fragments, the possibility that multiple surgeries may be required to treat the stone(s), MI, stroke, PE and the inherent risks of general anesthesia. The patient voices understanding and wishes to proceed.   We discussed criteria to return to clinic or proceed to the ER which include: Fever/chills, worsening pain, nausea/vomiting and/or persistent gross hematuria.

## 2021-04-05 ENCOUNTER — Encounter (HOSPITAL_BASED_OUTPATIENT_CLINIC_OR_DEPARTMENT_OTHER): Payer: Self-pay | Admitting: Urology

## 2021-05-16 DIAGNOSIS — N202 Calculus of kidney with calculus of ureter: Secondary | ICD-10-CM | POA: Diagnosis not present

## 2021-05-16 DIAGNOSIS — N23 Unspecified renal colic: Secondary | ICD-10-CM | POA: Diagnosis not present

## 2021-07-09 DIAGNOSIS — N130 Hydronephrosis with ureteropelvic junction obstruction: Secondary | ICD-10-CM | POA: Diagnosis not present

## 2021-07-09 DIAGNOSIS — N1330 Unspecified hydronephrosis: Secondary | ICD-10-CM | POA: Diagnosis not present

## 2021-07-09 DIAGNOSIS — R1084 Generalized abdominal pain: Secondary | ICD-10-CM | POA: Diagnosis not present

## 2021-07-18 DIAGNOSIS — M7742 Metatarsalgia, left foot: Secondary | ICD-10-CM | POA: Diagnosis not present

## 2021-07-18 DIAGNOSIS — Z0000 Encounter for general adult medical examination without abnormal findings: Secondary | ICD-10-CM | POA: Diagnosis not present

## 2021-07-18 DIAGNOSIS — Z79899 Other long term (current) drug therapy: Secondary | ICD-10-CM | POA: Diagnosis not present

## 2021-07-18 DIAGNOSIS — M7741 Metatarsalgia, right foot: Secondary | ICD-10-CM | POA: Diagnosis not present

## 2021-07-18 DIAGNOSIS — Z8249 Family history of ischemic heart disease and other diseases of the circulatory system: Secondary | ICD-10-CM | POA: Diagnosis not present

## 2021-07-18 DIAGNOSIS — E7800 Pure hypercholesterolemia, unspecified: Secondary | ICD-10-CM | POA: Diagnosis not present

## 2021-07-31 DIAGNOSIS — N201 Calculus of ureter: Secondary | ICD-10-CM | POA: Diagnosis not present

## 2021-07-31 DIAGNOSIS — N130 Hydronephrosis with ureteropelvic junction obstruction: Secondary | ICD-10-CM | POA: Diagnosis not present

## 2021-08-13 DIAGNOSIS — M9903 Segmental and somatic dysfunction of lumbar region: Secondary | ICD-10-CM | POA: Diagnosis not present

## 2021-08-13 DIAGNOSIS — M9902 Segmental and somatic dysfunction of thoracic region: Secondary | ICD-10-CM | POA: Diagnosis not present

## 2021-08-13 DIAGNOSIS — M9905 Segmental and somatic dysfunction of pelvic region: Secondary | ICD-10-CM | POA: Diagnosis not present

## 2021-08-13 DIAGNOSIS — M5441 Lumbago with sciatica, right side: Secondary | ICD-10-CM | POA: Diagnosis not present

## 2021-08-15 DIAGNOSIS — M5441 Lumbago with sciatica, right side: Secondary | ICD-10-CM | POA: Diagnosis not present

## 2021-08-15 DIAGNOSIS — M9905 Segmental and somatic dysfunction of pelvic region: Secondary | ICD-10-CM | POA: Diagnosis not present

## 2021-08-15 DIAGNOSIS — M9903 Segmental and somatic dysfunction of lumbar region: Secondary | ICD-10-CM | POA: Diagnosis not present

## 2021-08-15 DIAGNOSIS — M9902 Segmental and somatic dysfunction of thoracic region: Secondary | ICD-10-CM | POA: Diagnosis not present

## 2021-08-17 DIAGNOSIS — M9902 Segmental and somatic dysfunction of thoracic region: Secondary | ICD-10-CM | POA: Diagnosis not present

## 2021-08-17 DIAGNOSIS — M9905 Segmental and somatic dysfunction of pelvic region: Secondary | ICD-10-CM | POA: Diagnosis not present

## 2021-08-17 DIAGNOSIS — M9903 Segmental and somatic dysfunction of lumbar region: Secondary | ICD-10-CM | POA: Diagnosis not present

## 2021-08-17 DIAGNOSIS — M5441 Lumbago with sciatica, right side: Secondary | ICD-10-CM | POA: Diagnosis not present

## 2021-08-21 DIAGNOSIS — M9905 Segmental and somatic dysfunction of pelvic region: Secondary | ICD-10-CM | POA: Diagnosis not present

## 2021-08-21 DIAGNOSIS — M5441 Lumbago with sciatica, right side: Secondary | ICD-10-CM | POA: Diagnosis not present

## 2021-08-21 DIAGNOSIS — M9902 Segmental and somatic dysfunction of thoracic region: Secondary | ICD-10-CM | POA: Diagnosis not present

## 2021-08-21 DIAGNOSIS — M9903 Segmental and somatic dysfunction of lumbar region: Secondary | ICD-10-CM | POA: Diagnosis not present

## 2021-08-21 DIAGNOSIS — N201 Calculus of ureter: Secondary | ICD-10-CM | POA: Diagnosis not present

## 2021-08-24 DIAGNOSIS — M9905 Segmental and somatic dysfunction of pelvic region: Secondary | ICD-10-CM | POA: Diagnosis not present

## 2021-08-24 DIAGNOSIS — M62838 Other muscle spasm: Secondary | ICD-10-CM | POA: Diagnosis not present

## 2021-08-24 DIAGNOSIS — M9903 Segmental and somatic dysfunction of lumbar region: Secondary | ICD-10-CM | POA: Diagnosis not present

## 2021-08-24 DIAGNOSIS — M9902 Segmental and somatic dysfunction of thoracic region: Secondary | ICD-10-CM | POA: Diagnosis not present

## 2021-08-24 DIAGNOSIS — M5441 Lumbago with sciatica, right side: Secondary | ICD-10-CM | POA: Diagnosis not present

## 2021-08-28 DIAGNOSIS — M62838 Other muscle spasm: Secondary | ICD-10-CM | POA: Diagnosis not present

## 2021-08-28 DIAGNOSIS — M5441 Lumbago with sciatica, right side: Secondary | ICD-10-CM | POA: Diagnosis not present

## 2021-08-28 DIAGNOSIS — M9903 Segmental and somatic dysfunction of lumbar region: Secondary | ICD-10-CM | POA: Diagnosis not present

## 2021-08-28 DIAGNOSIS — M9902 Segmental and somatic dysfunction of thoracic region: Secondary | ICD-10-CM | POA: Diagnosis not present

## 2021-08-28 DIAGNOSIS — M9905 Segmental and somatic dysfunction of pelvic region: Secondary | ICD-10-CM | POA: Diagnosis not present

## 2021-09-05 DIAGNOSIS — M9902 Segmental and somatic dysfunction of thoracic region: Secondary | ICD-10-CM | POA: Diagnosis not present

## 2021-09-05 DIAGNOSIS — M9903 Segmental and somatic dysfunction of lumbar region: Secondary | ICD-10-CM | POA: Diagnosis not present

## 2021-09-05 DIAGNOSIS — M5441 Lumbago with sciatica, right side: Secondary | ICD-10-CM | POA: Diagnosis not present

## 2021-09-05 DIAGNOSIS — M9905 Segmental and somatic dysfunction of pelvic region: Secondary | ICD-10-CM | POA: Diagnosis not present

## 2021-09-12 DIAGNOSIS — M9902 Segmental and somatic dysfunction of thoracic region: Secondary | ICD-10-CM | POA: Diagnosis not present

## 2021-09-12 DIAGNOSIS — M9903 Segmental and somatic dysfunction of lumbar region: Secondary | ICD-10-CM | POA: Diagnosis not present

## 2021-09-12 DIAGNOSIS — M9905 Segmental and somatic dysfunction of pelvic region: Secondary | ICD-10-CM | POA: Diagnosis not present

## 2021-09-12 DIAGNOSIS — M5441 Lumbago with sciatica, right side: Secondary | ICD-10-CM | POA: Diagnosis not present

## 2021-09-17 DIAGNOSIS — M5441 Lumbago with sciatica, right side: Secondary | ICD-10-CM | POA: Diagnosis not present

## 2021-09-17 DIAGNOSIS — M9903 Segmental and somatic dysfunction of lumbar region: Secondary | ICD-10-CM | POA: Diagnosis not present

## 2021-09-17 DIAGNOSIS — M9905 Segmental and somatic dysfunction of pelvic region: Secondary | ICD-10-CM | POA: Diagnosis not present

## 2021-09-17 DIAGNOSIS — M9902 Segmental and somatic dysfunction of thoracic region: Secondary | ICD-10-CM | POA: Diagnosis not present

## 2021-09-24 DIAGNOSIS — M9905 Segmental and somatic dysfunction of pelvic region: Secondary | ICD-10-CM | POA: Diagnosis not present

## 2021-09-24 DIAGNOSIS — M9902 Segmental and somatic dysfunction of thoracic region: Secondary | ICD-10-CM | POA: Diagnosis not present

## 2021-09-24 DIAGNOSIS — M5441 Lumbago with sciatica, right side: Secondary | ICD-10-CM | POA: Diagnosis not present

## 2021-09-24 DIAGNOSIS — M9903 Segmental and somatic dysfunction of lumbar region: Secondary | ICD-10-CM | POA: Diagnosis not present

## 2021-09-27 DIAGNOSIS — M5441 Lumbago with sciatica, right side: Secondary | ICD-10-CM | POA: Diagnosis not present

## 2021-09-27 DIAGNOSIS — M9905 Segmental and somatic dysfunction of pelvic region: Secondary | ICD-10-CM | POA: Diagnosis not present

## 2021-09-27 DIAGNOSIS — M9903 Segmental and somatic dysfunction of lumbar region: Secondary | ICD-10-CM | POA: Diagnosis not present

## 2021-09-27 DIAGNOSIS — M9902 Segmental and somatic dysfunction of thoracic region: Secondary | ICD-10-CM | POA: Diagnosis not present

## 2021-10-31 DIAGNOSIS — M5441 Lumbago with sciatica, right side: Secondary | ICD-10-CM | POA: Diagnosis not present

## 2021-10-31 DIAGNOSIS — M9902 Segmental and somatic dysfunction of thoracic region: Secondary | ICD-10-CM | POA: Diagnosis not present

## 2021-10-31 DIAGNOSIS — M9905 Segmental and somatic dysfunction of pelvic region: Secondary | ICD-10-CM | POA: Diagnosis not present

## 2021-10-31 DIAGNOSIS — M9903 Segmental and somatic dysfunction of lumbar region: Secondary | ICD-10-CM | POA: Diagnosis not present

## 2021-11-14 DIAGNOSIS — Z01818 Encounter for other preprocedural examination: Secondary | ICD-10-CM | POA: Diagnosis not present

## 2021-11-20 DIAGNOSIS — M9902 Segmental and somatic dysfunction of thoracic region: Secondary | ICD-10-CM | POA: Diagnosis not present

## 2021-11-20 DIAGNOSIS — M9905 Segmental and somatic dysfunction of pelvic region: Secondary | ICD-10-CM | POA: Diagnosis not present

## 2021-11-20 DIAGNOSIS — M9903 Segmental and somatic dysfunction of lumbar region: Secondary | ICD-10-CM | POA: Diagnosis not present

## 2021-11-20 DIAGNOSIS — M5441 Lumbago with sciatica, right side: Secondary | ICD-10-CM | POA: Diagnosis not present

## 2021-12-14 DIAGNOSIS — J208 Acute bronchitis due to other specified organisms: Secondary | ICD-10-CM | POA: Diagnosis not present

## 2021-12-14 DIAGNOSIS — R058 Other specified cough: Secondary | ICD-10-CM | POA: Diagnosis not present

## 2021-12-14 DIAGNOSIS — Z03818 Encounter for observation for suspected exposure to other biological agents ruled out: Secondary | ICD-10-CM | POA: Diagnosis not present

## 2021-12-14 DIAGNOSIS — R062 Wheezing: Secondary | ICD-10-CM | POA: Diagnosis not present

## 2021-12-14 DIAGNOSIS — J069 Acute upper respiratory infection, unspecified: Secondary | ICD-10-CM | POA: Diagnosis not present

## 2021-12-17 DIAGNOSIS — J209 Acute bronchitis, unspecified: Secondary | ICD-10-CM | POA: Diagnosis not present

## 2021-12-17 DIAGNOSIS — J4541 Moderate persistent asthma with (acute) exacerbation: Secondary | ICD-10-CM | POA: Diagnosis not present

## 2021-12-24 DIAGNOSIS — M9902 Segmental and somatic dysfunction of thoracic region: Secondary | ICD-10-CM | POA: Diagnosis not present

## 2021-12-24 DIAGNOSIS — M9905 Segmental and somatic dysfunction of pelvic region: Secondary | ICD-10-CM | POA: Diagnosis not present

## 2021-12-24 DIAGNOSIS — M9903 Segmental and somatic dysfunction of lumbar region: Secondary | ICD-10-CM | POA: Diagnosis not present

## 2021-12-24 DIAGNOSIS — M5441 Lumbago with sciatica, right side: Secondary | ICD-10-CM | POA: Diagnosis not present

## 2021-12-31 DIAGNOSIS — M9905 Segmental and somatic dysfunction of pelvic region: Secondary | ICD-10-CM | POA: Diagnosis not present

## 2021-12-31 DIAGNOSIS — M5441 Lumbago with sciatica, right side: Secondary | ICD-10-CM | POA: Diagnosis not present

## 2021-12-31 DIAGNOSIS — M9902 Segmental and somatic dysfunction of thoracic region: Secondary | ICD-10-CM | POA: Diagnosis not present

## 2021-12-31 DIAGNOSIS — M9903 Segmental and somatic dysfunction of lumbar region: Secondary | ICD-10-CM | POA: Diagnosis not present

## 2022-01-10 DIAGNOSIS — M5441 Lumbago with sciatica, right side: Secondary | ICD-10-CM | POA: Diagnosis not present

## 2022-01-10 DIAGNOSIS — M9905 Segmental and somatic dysfunction of pelvic region: Secondary | ICD-10-CM | POA: Diagnosis not present

## 2022-01-10 DIAGNOSIS — M9902 Segmental and somatic dysfunction of thoracic region: Secondary | ICD-10-CM | POA: Diagnosis not present

## 2022-01-10 DIAGNOSIS — M9903 Segmental and somatic dysfunction of lumbar region: Secondary | ICD-10-CM | POA: Diagnosis not present

## 2022-01-21 DIAGNOSIS — M5441 Lumbago with sciatica, right side: Secondary | ICD-10-CM | POA: Diagnosis not present

## 2022-01-21 DIAGNOSIS — M9905 Segmental and somatic dysfunction of pelvic region: Secondary | ICD-10-CM | POA: Diagnosis not present

## 2022-01-21 DIAGNOSIS — M9903 Segmental and somatic dysfunction of lumbar region: Secondary | ICD-10-CM | POA: Diagnosis not present

## 2022-01-21 DIAGNOSIS — M9902 Segmental and somatic dysfunction of thoracic region: Secondary | ICD-10-CM | POA: Diagnosis not present

## 2022-02-04 DIAGNOSIS — M9903 Segmental and somatic dysfunction of lumbar region: Secondary | ICD-10-CM | POA: Diagnosis not present

## 2022-02-04 DIAGNOSIS — M5441 Lumbago with sciatica, right side: Secondary | ICD-10-CM | POA: Diagnosis not present

## 2022-02-04 DIAGNOSIS — M9905 Segmental and somatic dysfunction of pelvic region: Secondary | ICD-10-CM | POA: Diagnosis not present

## 2022-02-04 DIAGNOSIS — M9902 Segmental and somatic dysfunction of thoracic region: Secondary | ICD-10-CM | POA: Diagnosis not present

## 2022-02-15 DIAGNOSIS — D125 Benign neoplasm of sigmoid colon: Secondary | ICD-10-CM | POA: Diagnosis not present

## 2022-02-15 DIAGNOSIS — K5730 Diverticulosis of large intestine without perforation or abscess without bleeding: Secondary | ICD-10-CM | POA: Diagnosis not present

## 2022-02-15 DIAGNOSIS — K648 Other hemorrhoids: Secondary | ICD-10-CM | POA: Diagnosis not present

## 2022-02-15 DIAGNOSIS — Z1211 Encounter for screening for malignant neoplasm of colon: Secondary | ICD-10-CM | POA: Diagnosis not present

## 2022-02-18 DIAGNOSIS — M5441 Lumbago with sciatica, right side: Secondary | ICD-10-CM | POA: Diagnosis not present

## 2022-02-18 DIAGNOSIS — M9903 Segmental and somatic dysfunction of lumbar region: Secondary | ICD-10-CM | POA: Diagnosis not present

## 2022-02-18 DIAGNOSIS — M9902 Segmental and somatic dysfunction of thoracic region: Secondary | ICD-10-CM | POA: Diagnosis not present

## 2022-02-18 DIAGNOSIS — M9905 Segmental and somatic dysfunction of pelvic region: Secondary | ICD-10-CM | POA: Diagnosis not present

## 2022-03-07 IMAGING — CT CT RENAL STONE PROTOCOL
2 of 4 series · 17 of 46 positions shown, 19 images · non-contrast
Comparison: None.

CLINICAL DATA: Flank pain with kidney stone suspected

EXAM:
CT ABDOMEN AND PELVIS WITHOUT CONTRAST
TECHNIQUE: Multidetector CT imaging of the abdomen and pelvis was performed
following the standard protocol without IV contrast.

[Series 2: axial st · axial · 0.88mm/px · z∈[+1125,+1600]mm · 14 of 107 slices shown, 16 images]
[im 6/107  soft-tissue]
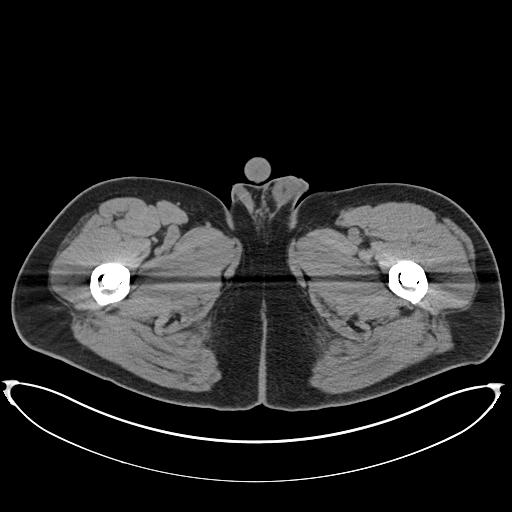
[im 6/107  bone]
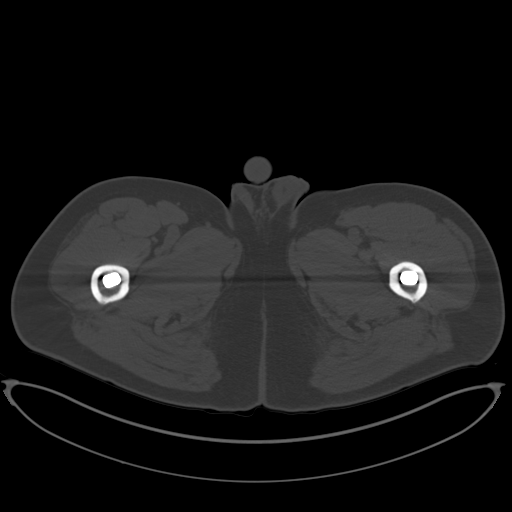
[im 16/107  soft-tissue]
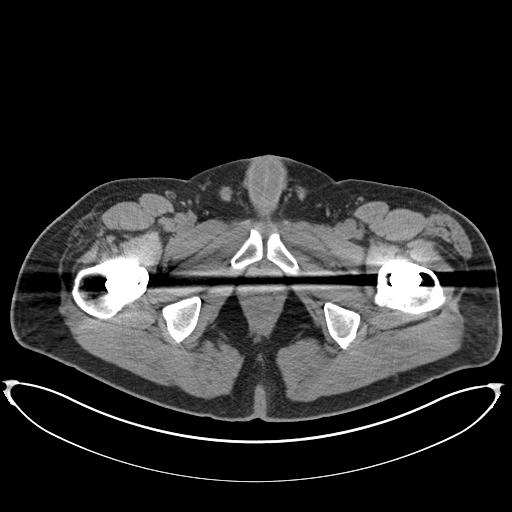
[im 21/107  soft-tissue]
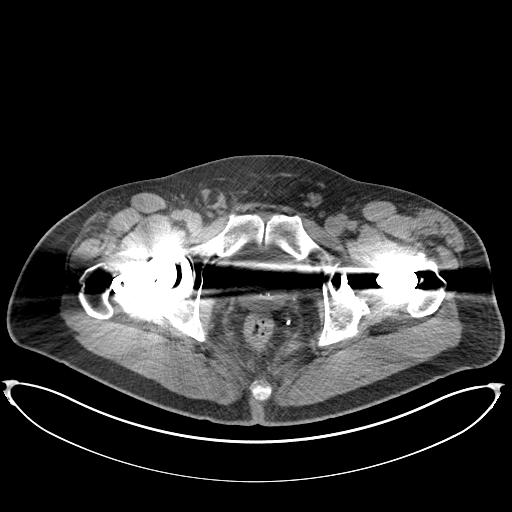
[im 31/107  soft-tissue]
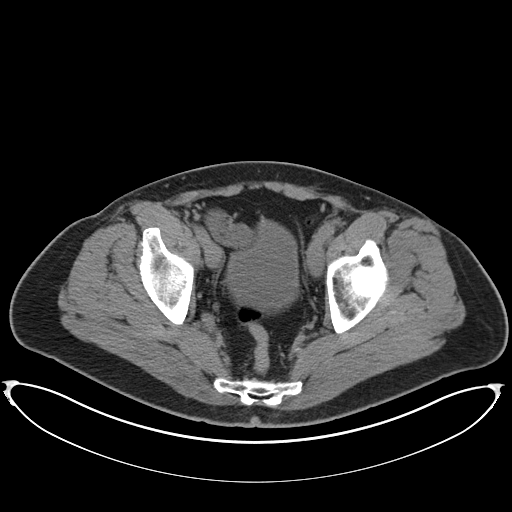
[im 36/107  soft-tissue]
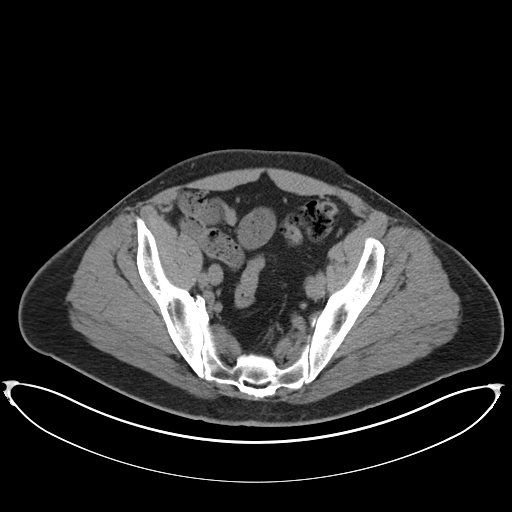
[im 41/107  soft-tissue]
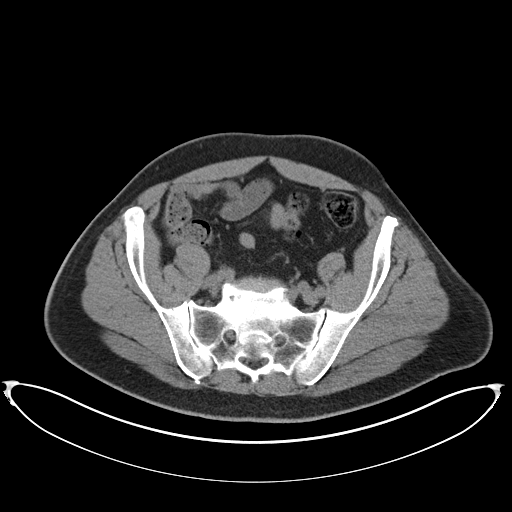
[im 51/107  soft-tissue]
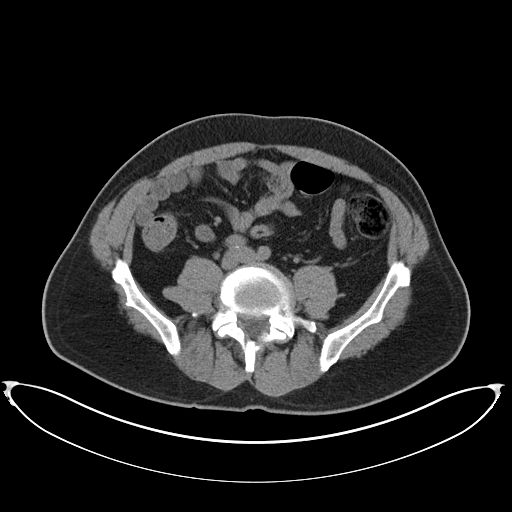
[im 56/107  soft-tissue]
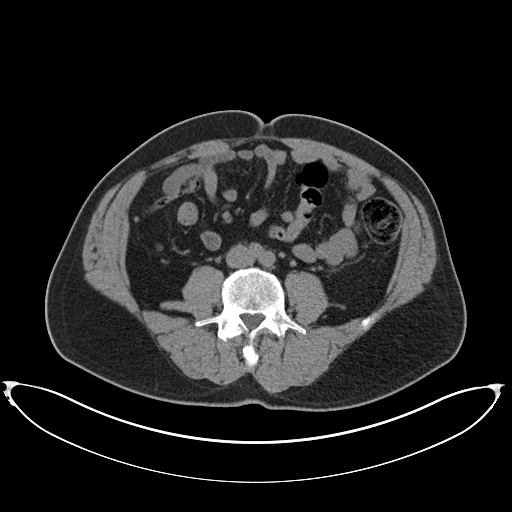
[im 66/107  soft-tissue]
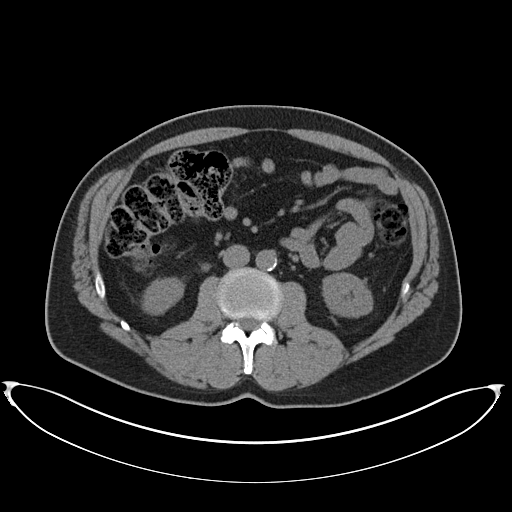
[im 66/107  bone]
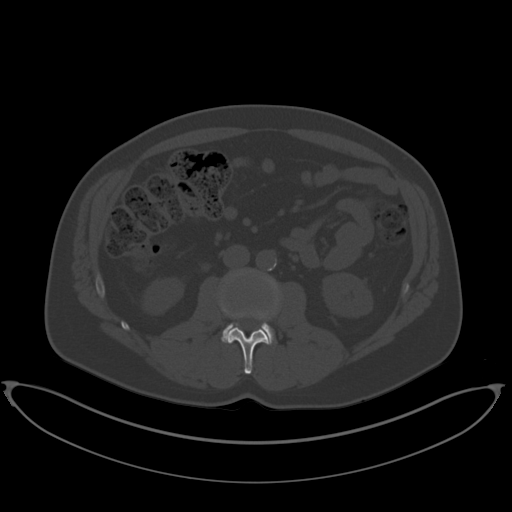
[im 71/107  soft-tissue]
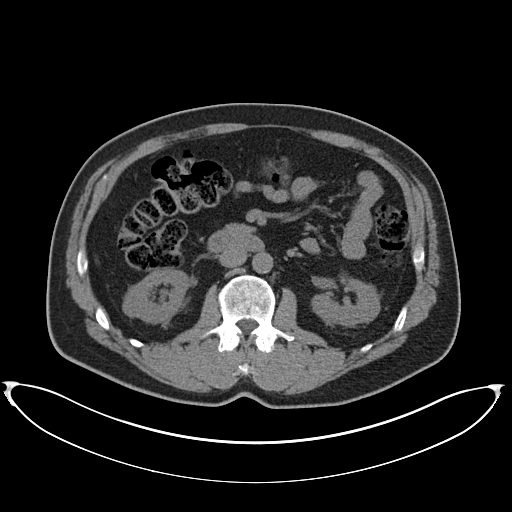
[im 81/107  soft-tissue]
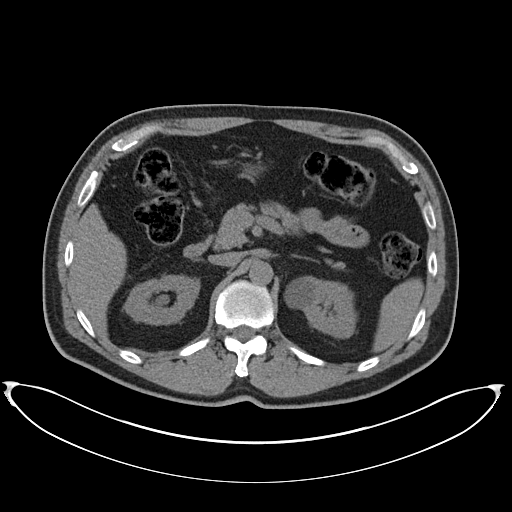
[im 86/107  soft-tissue]
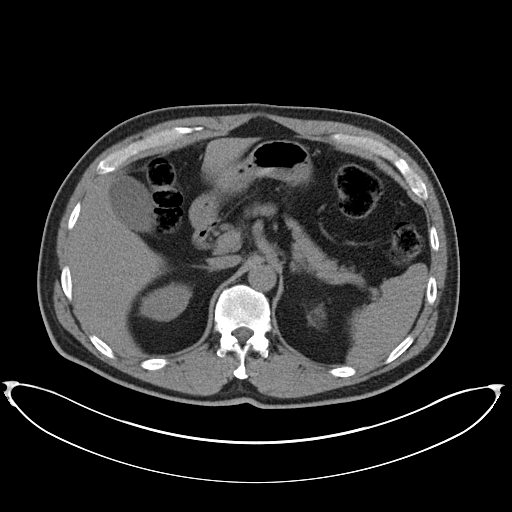
[im 91/107  soft-tissue]
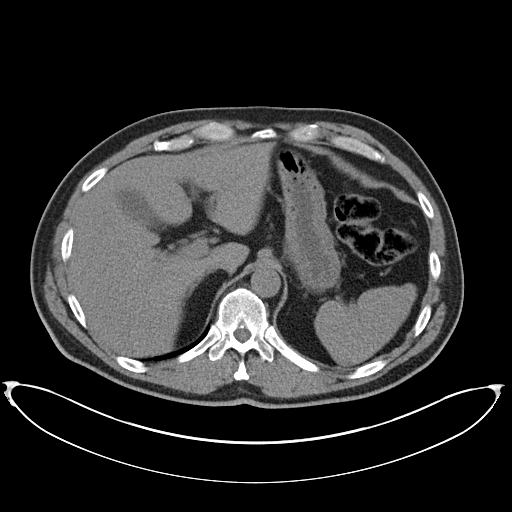
[im 101/107  soft-tissue]
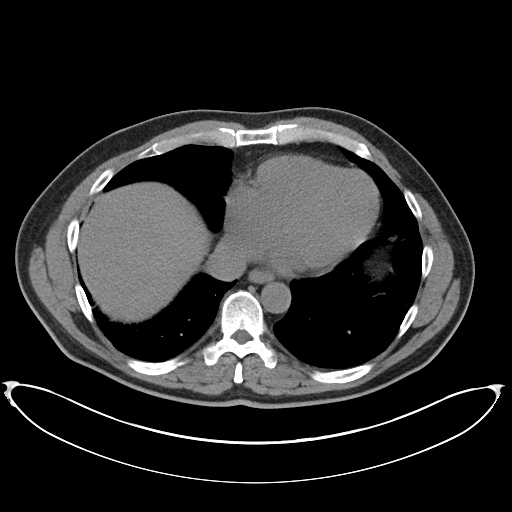

[Series 4: coronal · coronal · 0.90mm/px · 3 of 157 slices shown]
[im 53/157  soft-tissue]
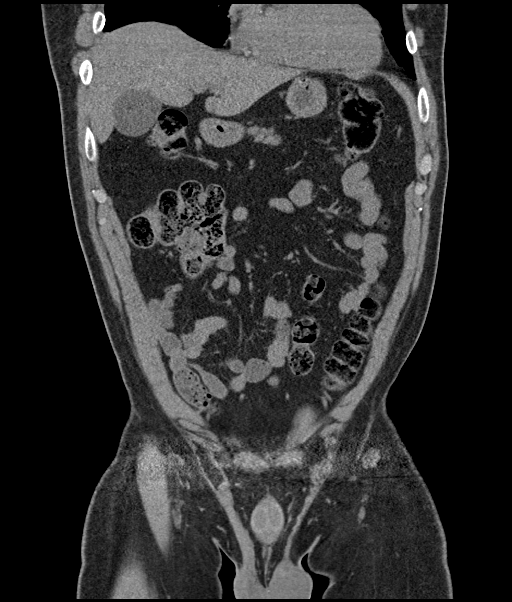
[im 70/157  soft-tissue]
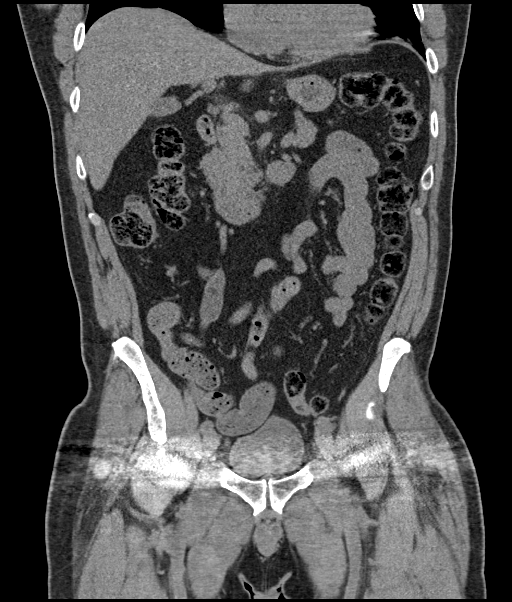
[im 87/157  soft-tissue]
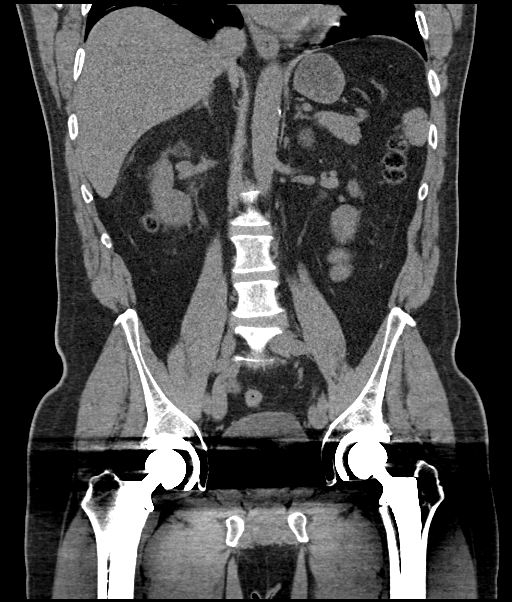

[17 of 46 positions shown; findings below may reference images not displayed]

FINDINGS: Lower chest: Streaky dependent opacity in the lungs attributed
atelectasis.

Hepatobiliary: No focal liver abnormality.No evidence of biliary
obstruction or stone.

Pancreas: Unremarkable.

Spleen: Unremarkable.

Adrenals/Urinary Tract: Negative adrenals. No hydronephrosis or
stone. 32 mm left upper pole renal cyst. Perinephric stranding which
is nonspecific and symmetric. Minor right hydronephrosis due to a 4
mm stone in the right ureter at the level of L3-4. Punctate right
lower pole calculus. Unremarkable bladder.

Stomach/Bowel:  No obstruction. No appendicitis.

Vascular/Lymphatic: No acute vascular abnormality. Atheromatous
calcifications of the aorta. No mass or adenopathy.

Reproductive:No pathologic findings.

Other: No ascites or pneumoperitoneum.

Musculoskeletal: No acute abnormalities. L5 chronic pars defects
with L5-S1 anterolisthesis and preferential disc space collapse.
IMPRESSION: 1. Mild right hydroureteronephrosis from a 4 mm proximal ureteric
stone.
2. Punctate right renal calculus.
3.  Aortic Atherosclerosis ([id number]-[id number].G).

## 2022-08-01 DIAGNOSIS — Z0000 Encounter for general adult medical examination without abnormal findings: Secondary | ICD-10-CM | POA: Diagnosis not present

## 2022-08-01 DIAGNOSIS — I700 Atherosclerosis of aorta: Secondary | ICD-10-CM | POA: Diagnosis not present

## 2022-08-01 DIAGNOSIS — Z125 Encounter for screening for malignant neoplasm of prostate: Secondary | ICD-10-CM | POA: Diagnosis not present

## 2022-08-01 DIAGNOSIS — E663 Overweight: Secondary | ICD-10-CM | POA: Diagnosis not present

## 2022-08-01 DIAGNOSIS — E7800 Pure hypercholesterolemia, unspecified: Secondary | ICD-10-CM | POA: Diagnosis not present

## 2022-08-01 DIAGNOSIS — Z5181 Encounter for therapeutic drug level monitoring: Secondary | ICD-10-CM | POA: Diagnosis not present

## 2022-08-01 DIAGNOSIS — Z131 Encounter for screening for diabetes mellitus: Secondary | ICD-10-CM | POA: Diagnosis not present

## 2023-01-22 ENCOUNTER — Encounter: Payer: Self-pay | Admitting: Cardiology

## 2023-01-27 ENCOUNTER — Ambulatory Visit: Payer: BC Managed Care – PPO | Admitting: Cardiology

## 2023-02-24 NOTE — Progress Notes (Signed)
Cardiology Office Note:   Date:  02/27/2023  ID:  Jorge Mcclain, DOB [birth year], MRN [id number]  History of Present Illness:   Jorge Mcclain is a 62 y.o. male with coronary artery calcification and HLD who was remotely followed by Dr. Tamala Julian who now is re-referred by Dr. Sabra Heck to re-establish care.  Patient seen by Dr. Tamala Julian in 2019. Ca score 14 (50%). ETT with no ischemia.   Today, the patient overall feels well. No chest pain, SOB, LE edema, orthopnea or PND. Remains active and is able to walk about 10,000 steps per day and lifts weights without exertional symptoms. Has been working on healthy diet and has cut back on sugars or grains.   ROS: As per HPI  Studies Reviewed:    EKG:  NSR, with HR 64  Cardiac Studies & Procedures     STRESS TESTS  EXERCISE TOLERANCE TEST (ETT) 08/07/2018  Narrative  Blood pressure demonstrated a normal response to exercise.  There was no ST segment deviation noted during stress.  No T wave inversion was noted during stress.  Normal ECG stress test.      CT SCANS  CT CARDIAC SCORING (SELF PAY ONLY) 08/03/2018  Addendum 08/03/2018  3:06 PM ADDENDUM REPORT: 08/03/2018 15:04  CLINICAL DATA:  Risk stratification  EXAM: Coronary Calcium Score  TECHNIQUE: The patient was scanned on a [employer] scanner. Axial non-contrast 3 mm slices were carried out through the heart. The data set was analyzed on a dedicated work station and scored using the [place].  FINDINGS: Non-cardiac: See separate report from [facility].  Ascending Aorta: Normal size.  Minimal calcifications.  Pericardium: Normal.  Coronary arteries: Normal origin.  IMPRESSION: Coronary calcium score of 14. This was 74 percentile for age and sex matched control.   Electronically Signed By: Ena Dawley On: 08/03/2018 15:04  Narrative EXAM: OVER-READ INTERPRETATION  CT CHEST  The following report is an over-read performed by radiologist Dr. Abigail Miyamoto of [facility], [place] on 08/03/2018. This over-read does not include interpretation of cardiac or coronary anatomy or pathology. The coronary calcium interpretation by the cardiologist is attached.  COMPARISON:  Chest radiograph 07/23/2013  FINDINGS: Vascular: Normal aortic caliber.  Aortic atherosclerosis.  Mediastinum/Nodes: No imaged thoracic adenopathy.  Lungs/Pleura: No imaged pleural fluid. Bibasilar subsegmental atelectasis or scar.  Upper Abdomen: Mild hepatic steatosis. Normal imaged portions of the spleen, stomach.  Musculoskeletal: No acute osseous abnormality. Mild right hemidiaphragm elevation.  IMPRESSION: 1.  No acute findings in the imaged extracardiac chest. 2. Mild hepatic steatosis. 3.  Aortic Atherosclerosis (ICD10-I70.0).  Electronically Signed: By: Abigail Miyamoto M.D. On: 08/03/2018 10:47          Risk Assessment/Calculations:              Physical Exam:   VS:  BP 132/80   Pulse 73   Ht 6' (1.829 m)   Wt 219 lb 12.8 oz (99.7 kg)   SpO2 95%   BMI 29.81 kg/m    Wt Readings from Last 3 Encounters:  02/27/23 219 lb 12.8 oz (99.7 kg)  04/04/21 210 lb 6.4 oz (95.4 kg)  04/03/21 205 lb (93 kg)     GEN: Well nourished, well developed in no acute distress NECK: No JVD; No carotid bruits CARDIAC: RRR, no murmurs, rubs, gallops RESPIRATORY:  Clear to auscultation without rales, wheezing or rhonchi  ABDOMEN: Soft, non-tender, non-distended EXTREMITIES:  No edema; No deformity   ASSESSMENT AND PLAN:   #Coronary  Artery Ca: -Ca score 14 (50%) in 2019 -ETT In 2019 normal  -Currently doing well without anginal symptoms -Continue ASA 81mg  daily -Continue lipitor 80mg  daily -Repeat lipids, Lp(a) and apolipoprotein B in 6 weeks to reassess LDL; goal <70  #HLD: -Continue lipitor 80mg  daily -Last LDL above goal at 80 but has significantly changed diet -Repeat lipids, Lp(a) and apolipoprotein B in 6 weeks to reassess LDL on current diet;  goal <70 -If not at goal, will start zetia 10mg  daily        Signed, Freada Bergeron, MD

## 2023-02-27 ENCOUNTER — Ambulatory Visit: Payer: BC Managed Care – PPO | Attending: Cardiology | Admitting: Cardiology

## 2023-02-27 ENCOUNTER — Encounter: Payer: Self-pay | Admitting: Cardiology

## 2023-02-27 VITALS — BP 132/80 | HR 73 | Ht 72.0 in | Wt 219.8 lb

## 2023-02-27 DIAGNOSIS — E7800 Pure hypercholesterolemia, unspecified: Secondary | ICD-10-CM

## 2023-02-27 DIAGNOSIS — I2584 Coronary atherosclerosis due to calcified coronary lesion: Secondary | ICD-10-CM | POA: Diagnosis not present

## 2023-02-27 DIAGNOSIS — I2510 Atherosclerotic heart disease of native coronary artery without angina pectoris: Secondary | ICD-10-CM

## 2023-02-27 NOTE — Patient Instructions (Signed)
Medication Instructions:  Your physician recommends that you continue on your current medications as directed. Please refer to the Current Medication list given to you today.  *If you need a refill on your cardiac medications before your next appointment, please call your pharmacy*   Lab Work: IN 6 WEEKS: NMR, Lipoprotein, Apolipoprotein If you have labs (blood work) drawn today and your tests are completely normal, you will receive your results only by: [place] (if you have MyChart) OR A paper copy in the mail If you have any lab test that is abnormal or we need to change your treatment, we will call you to review the results.  Follow-Up: At [facility], you and your health needs are our priority.  As part of our continuing mission to provide you with exceptional heart care, we have created designated Provider Care Teams.  These Care Teams include your primary Cardiologist (physician) and Advanced Practice Providers (APPs -  Physician Assistants and Nurse Practitioners) who all work together to provide you with the care you need, when you need it.  Your next appointment:   1 year(s)  Provider:   Freada Bergeron, MD

## 2023-04-15 ENCOUNTER — Ambulatory Visit: Payer: BC Managed Care – PPO | Attending: Cardiology

## 2023-04-15 DIAGNOSIS — E7800 Pure hypercholesterolemia, unspecified: Secondary | ICD-10-CM

## 2023-04-16 LAB — APOLIPOPROTEIN B: Apolipoprotein B: 83 mg/dL (ref <90)

## 2023-04-16 LAB — LIPOPROTEIN A (LPA): Lipoprotein (a): 8.9 nmol/L (ref <75.0)

## 2023-04-16 LAB — NMR, LIPOPROFILE
Cholesterol, Total: 151 mg/dL (ref 100–199)
HDL Particle Number: 33.6 umol/L (ref >=30.5)
HDL-C: 46 mg/dL (ref >39)
LDL Particle Number: 1073 nmol/L — ABNORMAL HIGH (ref <1000)
LDL Size: 20.3 nm — ABNORMAL LOW (ref >20.5)
LDL-C (NIH Calc): 85 mg/dL (ref 0–99)
LP-IR Score: 47 — ABNORMAL HIGH (ref <=45)
Small LDL Particle Number: 597 nmol/L — ABNORMAL HIGH (ref <=527)
Triglycerides: 109 mg/dL (ref 0–149)

## 2023-04-17 ENCOUNTER — Telehealth: Payer: Self-pay | Admitting: *Deleted

## 2023-04-17 DIAGNOSIS — E7800 Pure hypercholesterolemia, unspecified: Secondary | ICD-10-CM

## 2023-04-17 DIAGNOSIS — Z79899 Other long term (current) drug therapy: Secondary | ICD-10-CM

## 2023-04-17 DIAGNOSIS — I2510 Atherosclerotic heart disease of native coronary artery without angina pectoris: Secondary | ICD-10-CM

## 2023-04-17 MED ORDER — EZETIMIBE 10 MG PO TABS
10.0000 mg | ORAL_TABLET | Freq: Every day | ORAL | 2 refills | Status: DC
Start: 2023-04-17 — End: 2023-10-07

## 2023-04-17 NOTE — Telephone Encounter (Signed)
-----   Message from Meriam Sprague, MD sent at 04/17/2023 10:40 AM EDT ----- Lipids above goal with elevated LDL 85 (goal <70) and higher particle number. 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 Our goal is to drive LDL <16. Can we add zetia 10mg  daily and repeat lipids in 8 weeks to reassess?

## 2023-04-17 NOTE — Telephone Encounter (Signed)
The patient has been notified of the result and verbalized understanding.  All questions (if any) were answered.  Pt aware to start taking zetia 10 mg po daily and come in for repeat lipids in 8 weeks to reassess.   Confirmed the pharmacy of choice with the pt.  Scheduled the pt for repeat lipids in 8 weeks on 06/17/23.  He is aware to come fasting to this lab appt.   Pt verbalized understanding and agrees with this plan.

## 2023-06-17 ENCOUNTER — Ambulatory Visit: Payer: BC Managed Care – PPO | Attending: Cardiology

## 2023-06-17 DIAGNOSIS — I2510 Atherosclerotic heart disease of native coronary artery without angina pectoris: Secondary | ICD-10-CM

## 2023-06-17 DIAGNOSIS — E7800 Pure hypercholesterolemia, unspecified: Secondary | ICD-10-CM | POA: Diagnosis not present

## 2023-06-17 DIAGNOSIS — I2584 Coronary atherosclerosis due to calcified coronary lesion: Secondary | ICD-10-CM | POA: Diagnosis not present

## 2023-06-17 DIAGNOSIS — Z79899 Other long term (current) drug therapy: Secondary | ICD-10-CM | POA: Diagnosis not present

## 2023-06-18 LAB — LIPID PANEL
Chol/HDL Ratio: 2.1 ratio (ref 0.0–5.0)
Cholesterol, Total: 128 mg/dL (ref 100–199)
HDL: 61 mg/dL (ref >39)
LDL Chol Calc (NIH): 52 mg/dL (ref 0–99)
Triglycerides: 74 mg/dL (ref 0–149)
VLDL Cholesterol Cal: 15 mg/dL (ref 5–40)

## 2023-07-08 DIAGNOSIS — L570 Actinic keratosis: Secondary | ICD-10-CM | POA: Diagnosis not present

## 2023-07-08 DIAGNOSIS — L578 Other skin changes due to chronic exposure to nonionizing radiation: Secondary | ICD-10-CM | POA: Diagnosis not present

## 2023-07-08 DIAGNOSIS — L814 Other melanin hyperpigmentation: Secondary | ICD-10-CM | POA: Diagnosis not present

## 2023-07-08 DIAGNOSIS — L821 Other seborrheic keratosis: Secondary | ICD-10-CM | POA: Diagnosis not present

## 2023-07-08 DIAGNOSIS — D225 Melanocytic nevi of trunk: Secondary | ICD-10-CM | POA: Diagnosis not present

## 2023-08-08 DIAGNOSIS — I700 Atherosclerosis of aorta: Secondary | ICD-10-CM | POA: Diagnosis not present

## 2023-08-08 DIAGNOSIS — E663 Overweight: Secondary | ICD-10-CM | POA: Diagnosis not present

## 2023-08-08 DIAGNOSIS — Z0000 Encounter for general adult medical examination without abnormal findings: Secondary | ICD-10-CM | POA: Diagnosis not present

## 2023-08-08 DIAGNOSIS — R931 Abnormal findings on diagnostic imaging of heart and coronary circulation: Secondary | ICD-10-CM | POA: Diagnosis not present

## 2023-08-08 DIAGNOSIS — E7800 Pure hypercholesterolemia, unspecified: Secondary | ICD-10-CM | POA: Diagnosis not present

## 2023-08-26 DIAGNOSIS — L578 Other skin changes due to chronic exposure to nonionizing radiation: Secondary | ICD-10-CM | POA: Diagnosis not present

## 2023-10-07 ENCOUNTER — Other Ambulatory Visit: Payer: Self-pay

## 2023-10-07 DIAGNOSIS — I2510 Atherosclerotic heart disease of native coronary artery without angina pectoris: Secondary | ICD-10-CM

## 2023-10-07 DIAGNOSIS — E7800 Pure hypercholesterolemia, unspecified: Secondary | ICD-10-CM

## 2023-10-07 DIAGNOSIS — Z79899 Other long term (current) drug therapy: Secondary | ICD-10-CM

## 2023-10-07 MED ORDER — EZETIMIBE 10 MG PO TABS
10.0000 mg | ORAL_TABLET | Freq: Every day | ORAL | 1 refills | Status: DC
Start: 2023-10-07 — End: 2024-06-25

## 2023-12-15 DIAGNOSIS — M25512 Pain in left shoulder: Secondary | ICD-10-CM | POA: Diagnosis not present

## 2023-12-29 DIAGNOSIS — L570 Actinic keratosis: Secondary | ICD-10-CM | POA: Diagnosis not present

## 2023-12-29 DIAGNOSIS — L578 Other skin changes due to chronic exposure to nonionizing radiation: Secondary | ICD-10-CM | POA: Diagnosis not present

## 2024-01-28 DIAGNOSIS — M25512 Pain in left shoulder: Secondary | ICD-10-CM | POA: Diagnosis not present

## 2024-02-19 DIAGNOSIS — M25512 Pain in left shoulder: Secondary | ICD-10-CM | POA: Diagnosis not present

## 2024-02-20 ENCOUNTER — Other Ambulatory Visit: Payer: Self-pay | Admitting: Physician Assistant

## 2024-02-20 DIAGNOSIS — Z79899 Other long term (current) drug therapy: Secondary | ICD-10-CM

## 2024-02-20 DIAGNOSIS — E7800 Pure hypercholesterolemia, unspecified: Secondary | ICD-10-CM

## 2024-02-20 DIAGNOSIS — I2510 Atherosclerotic heart disease of native coronary artery without angina pectoris: Secondary | ICD-10-CM

## 2024-03-01 DIAGNOSIS — M7542 Impingement syndrome of left shoulder: Secondary | ICD-10-CM | POA: Diagnosis not present

## 2024-04-13 DIAGNOSIS — M24612 Ankylosis, left shoulder: Secondary | ICD-10-CM | POA: Diagnosis not present

## 2024-04-13 DIAGNOSIS — M75112 Incomplete rotator cuff tear or rupture of left shoulder, not specified as traumatic: Secondary | ICD-10-CM | POA: Diagnosis not present

## 2024-04-13 DIAGNOSIS — M19012 Primary osteoarthritis, left shoulder: Secondary | ICD-10-CM | POA: Diagnosis not present

## 2024-04-13 DIAGNOSIS — G8918 Other acute postprocedural pain: Secondary | ICD-10-CM | POA: Diagnosis not present

## 2024-04-13 DIAGNOSIS — X58XXXA Exposure to other specified factors, initial encounter: Secondary | ICD-10-CM | POA: Diagnosis not present

## 2024-04-13 DIAGNOSIS — M7542 Impingement syndrome of left shoulder: Secondary | ICD-10-CM | POA: Diagnosis not present

## 2024-04-13 DIAGNOSIS — S43432A Superior glenoid labrum lesion of left shoulder, initial encounter: Secondary | ICD-10-CM | POA: Diagnosis not present

## 2024-04-13 DIAGNOSIS — M65912 Unspecified synovitis and tenosynovitis, left shoulder: Secondary | ICD-10-CM | POA: Diagnosis not present

## 2024-04-13 DIAGNOSIS — M24112 Other articular cartilage disorders, left shoulder: Secondary | ICD-10-CM | POA: Diagnosis not present

## 2024-04-13 DIAGNOSIS — Y999 Unspecified external cause status: Secondary | ICD-10-CM | POA: Diagnosis not present

## 2024-04-21 DIAGNOSIS — M25512 Pain in left shoulder: Secondary | ICD-10-CM | POA: Diagnosis not present

## 2024-04-26 DIAGNOSIS — M25512 Pain in left shoulder: Secondary | ICD-10-CM | POA: Diagnosis not present

## 2024-05-05 DIAGNOSIS — M25512 Pain in left shoulder: Secondary | ICD-10-CM | POA: Diagnosis not present

## 2024-05-12 DIAGNOSIS — M25512 Pain in left shoulder: Secondary | ICD-10-CM | POA: Diagnosis not present

## 2024-05-19 DIAGNOSIS — M25512 Pain in left shoulder: Secondary | ICD-10-CM | POA: Diagnosis not present

## 2024-05-25 DIAGNOSIS — M25512 Pain in left shoulder: Secondary | ICD-10-CM | POA: Diagnosis not present

## 2024-05-27 DIAGNOSIS — M25512 Pain in left shoulder: Secondary | ICD-10-CM | POA: Diagnosis not present

## 2024-06-01 DIAGNOSIS — M25512 Pain in left shoulder: Secondary | ICD-10-CM | POA: Diagnosis not present

## 2024-06-08 DIAGNOSIS — M25512 Pain in left shoulder: Secondary | ICD-10-CM | POA: Diagnosis not present

## 2024-06-15 DIAGNOSIS — M25512 Pain in left shoulder: Secondary | ICD-10-CM | POA: Diagnosis not present

## 2024-06-17 DIAGNOSIS — M25512 Pain in left shoulder: Secondary | ICD-10-CM | POA: Diagnosis not present

## 2024-06-22 DIAGNOSIS — M25512 Pain in left shoulder: Secondary | ICD-10-CM | POA: Diagnosis not present

## 2024-06-24 DIAGNOSIS — M25512 Pain in left shoulder: Secondary | ICD-10-CM | POA: Diagnosis not present

## 2024-06-25 ENCOUNTER — Other Ambulatory Visit: Payer: Self-pay

## 2024-06-25 DIAGNOSIS — Z79899 Other long term (current) drug therapy: Secondary | ICD-10-CM

## 2024-06-25 DIAGNOSIS — E7800 Pure hypercholesterolemia, unspecified: Secondary | ICD-10-CM

## 2024-06-25 DIAGNOSIS — I2510 Atherosclerotic heart disease of native coronary artery without angina pectoris: Secondary | ICD-10-CM

## 2024-06-25 MED ORDER — EZETIMIBE 10 MG PO TABS
10.0000 mg | ORAL_TABLET | Freq: Every day | ORAL | 0 refills | Status: DC
Start: 2024-06-25 — End: 2024-07-28

## 2024-06-29 DIAGNOSIS — M25512 Pain in left shoulder: Secondary | ICD-10-CM | POA: Diagnosis not present

## 2024-07-01 DIAGNOSIS — M25512 Pain in left shoulder: Secondary | ICD-10-CM | POA: Diagnosis not present

## 2024-07-06 DIAGNOSIS — M25512 Pain in left shoulder: Secondary | ICD-10-CM | POA: Diagnosis not present

## 2024-07-20 DIAGNOSIS — M25512 Pain in left shoulder: Secondary | ICD-10-CM | POA: Diagnosis not present

## 2024-07-26 ENCOUNTER — Other Ambulatory Visit: Payer: Self-pay | Admitting: Physician Assistant

## 2024-07-26 DIAGNOSIS — E7800 Pure hypercholesterolemia, unspecified: Secondary | ICD-10-CM

## 2024-07-26 DIAGNOSIS — I2510 Atherosclerotic heart disease of native coronary artery without angina pectoris: Secondary | ICD-10-CM

## 2024-07-26 DIAGNOSIS — Z79899 Other long term (current) drug therapy: Secondary | ICD-10-CM

## 2024-08-12 DIAGNOSIS — R931 Abnormal findings on diagnostic imaging of heart and coronary circulation: Secondary | ICD-10-CM | POA: Diagnosis not present

## 2024-08-12 DIAGNOSIS — Z0000 Encounter for general adult medical examination without abnormal findings: Secondary | ICD-10-CM | POA: Diagnosis not present

## 2024-08-12 DIAGNOSIS — Z125 Encounter for screening for malignant neoplasm of prostate: Secondary | ICD-10-CM | POA: Diagnosis not present

## 2024-08-12 DIAGNOSIS — E7800 Pure hypercholesterolemia, unspecified: Secondary | ICD-10-CM | POA: Diagnosis not present

## 2024-08-12 DIAGNOSIS — Z131 Encounter for screening for diabetes mellitus: Secondary | ICD-10-CM | POA: Diagnosis not present

## 2024-10-14 DIAGNOSIS — R972 Elevated prostate specific antigen [PSA]: Secondary | ICD-10-CM | POA: Diagnosis not present

## 2024-11-01 ENCOUNTER — Other Ambulatory Visit: Payer: Self-pay | Admitting: Physician Assistant

## 2024-11-01 DIAGNOSIS — I2510 Atherosclerotic heart disease of native coronary artery without angina pectoris: Secondary | ICD-10-CM

## 2024-11-01 DIAGNOSIS — E7800 Pure hypercholesterolemia, unspecified: Secondary | ICD-10-CM

## 2024-11-01 DIAGNOSIS — Z79899 Other long term (current) drug therapy: Secondary | ICD-10-CM

## 2024-11-17 ENCOUNTER — Other Ambulatory Visit: Payer: Self-pay | Admitting: Physician Assistant

## 2024-11-17 DIAGNOSIS — E7800 Pure hypercholesterolemia, unspecified: Secondary | ICD-10-CM

## 2024-11-17 DIAGNOSIS — I2510 Atherosclerotic heart disease of native coronary artery without angina pectoris: Secondary | ICD-10-CM

## 2024-11-17 DIAGNOSIS — Z79899 Other long term (current) drug therapy: Secondary | ICD-10-CM

## 2024-11-22 ENCOUNTER — Other Ambulatory Visit: Payer: Self-pay | Admitting: Physician Assistant

## 2024-11-22 DIAGNOSIS — Z79899 Other long term (current) drug therapy: Secondary | ICD-10-CM

## 2024-11-22 DIAGNOSIS — E7800 Pure hypercholesterolemia, unspecified: Secondary | ICD-10-CM

## 2024-11-22 DIAGNOSIS — I2510 Atherosclerotic heart disease of native coronary artery without angina pectoris: Secondary | ICD-10-CM

## 2024-11-26 ENCOUNTER — Other Ambulatory Visit: Payer: Self-pay | Admitting: Physician Assistant

## 2024-11-26 DIAGNOSIS — E7800 Pure hypercholesterolemia, unspecified: Secondary | ICD-10-CM

## 2024-11-26 DIAGNOSIS — I2510 Atherosclerotic heart disease of native coronary artery without angina pectoris: Secondary | ICD-10-CM

## 2024-11-26 DIAGNOSIS — Z79899 Other long term (current) drug therapy: Secondary | ICD-10-CM

## 2024-12-31 ENCOUNTER — Telehealth: Payer: Self-pay | Admitting: Physician Assistant

## 2024-12-31 DIAGNOSIS — I2510 Atherosclerotic heart disease of native coronary artery without angina pectoris: Secondary | ICD-10-CM

## 2024-12-31 DIAGNOSIS — E7800 Pure hypercholesterolemia, unspecified: Secondary | ICD-10-CM

## 2024-12-31 DIAGNOSIS — Z79899 Other long term (current) drug therapy: Secondary | ICD-10-CM

## 2024-12-31 MED ORDER — EZETIMIBE 10 MG PO TABS: 10.0000 mg | ORAL_TABLET | Freq: Every day | ORAL | 0 refills | Status: AC

## 2024-12-31 NOTE — Telephone Encounter (Signed)
 Yes. If he is willing, would come in for fasting Lipids, CMET prior to OV.  Glendia Ferrier, PA-C    12/31/2024 11:42 AM

## 2024-12-31 NOTE — Telephone Encounter (Signed)
 Pt scheduled to see Glendia Ferrier, 03/14/25.  Last lipid 07/2024 in Care Everywhere.

## 2024-12-31 NOTE — Telephone Encounter (Signed)
" °*  STAT* If patient is at the pharmacy, call can be transferred to refill team.   1. Which medications need to be refilled? (please list name of each medication and dose if known)   ezetimibe  (ZETIA ) 10 MG tablet   2. Would you like to learn more about the convenience, safety, & potential cost savings by using the [facility]?   3. Are you open to using the [facility] (Type [facility]. ).  4. Which pharmacy/location (including street and city if local pharmacy) is medication to be sent to?  [id number] - [place], [place] - [address]   5. Do they need a 30 day or 90 day supply?    Patient stated he is completely out of this medication.  Patient has appointment scheduled with CANDIE Ferrier, PA on 4/20. "

## 2025-03-14 ENCOUNTER — Ambulatory Visit: Admitting: Physician Assistant
